# Patient Record
Sex: Female | Born: 1960 | Race: White | Hispanic: No | Marital: Married | Smoking: Never smoker
Health system: Southern US, Community
[De-identification: ages and names within clinical notes are randomized; demographics above are authoritative.]

## PROBLEM LIST (undated history)

## (undated) DIAGNOSIS — C50919 Malignant neoplasm of unspecified site of unspecified female breast: Secondary | ICD-10-CM

## (undated) HISTORY — PX: LAPAROSCOPY: SHX197

## (undated) HISTORY — DX: Malignant neoplasm of unspecified site of unspecified female breast: C50.919

## (undated) HISTORY — PX: HYSTEROTOMY: SHX1776

## (undated) HISTORY — PX: OTHER SURGICAL HISTORY: SHX169

## (undated) HISTORY — PX: PORT WINE STAIN REMOVAL W/ LASER: SHX2245

## (undated) HISTORY — PX: PORTACATH PLACEMENT: SHX2246

---

## 1998-01-02 ENCOUNTER — Inpatient Hospital Stay (HOSPITAL_COMMUNITY): Admission: AD | Admit: 1998-01-02 | Discharge: 1998-01-06 | Payer: Self-pay | Admitting: *Deleted

## 1998-01-06 ENCOUNTER — Encounter (HOSPITAL_COMMUNITY): Admission: RE | Admit: 1998-01-06 | Discharge: 1998-04-06 | Payer: Self-pay | Admitting: *Deleted

## 1998-02-23 ENCOUNTER — Other Ambulatory Visit: Admission: RE | Admit: 1998-02-23 | Discharge: 1998-02-23 | Payer: Self-pay | Admitting: *Deleted

## 1999-05-10 ENCOUNTER — Other Ambulatory Visit: Admission: RE | Admit: 1999-05-10 | Discharge: 1999-05-10 | Payer: Self-pay | Admitting: *Deleted

## 2000-07-01 ENCOUNTER — Other Ambulatory Visit: Admission: RE | Admit: 2000-07-01 | Discharge: 2000-07-01 | Payer: Self-pay | Admitting: *Deleted

## 2001-07-09 ENCOUNTER — Other Ambulatory Visit: Admission: RE | Admit: 2001-07-09 | Discharge: 2001-07-09 | Payer: Self-pay | Admitting: *Deleted

## 2002-10-13 ENCOUNTER — Other Ambulatory Visit: Admission: RE | Admit: 2002-10-13 | Discharge: 2002-10-13 | Payer: Self-pay | Admitting: *Deleted

## 2003-12-28 ENCOUNTER — Other Ambulatory Visit: Admission: RE | Admit: 2003-12-28 | Discharge: 2003-12-28 | Payer: Self-pay | Admitting: *Deleted

## 2005-10-08 DIAGNOSIS — C50919 Malignant neoplasm of unspecified site of unspecified female breast: Secondary | ICD-10-CM

## 2005-10-08 HISTORY — DX: Malignant neoplasm of unspecified site of unspecified female breast: C50.919

## 2006-08-06 ENCOUNTER — Encounter: Admission: RE | Admit: 2006-08-06 | Discharge: 2006-08-06 | Payer: Self-pay | Admitting: Obstetrics and Gynecology

## 2006-08-06 ENCOUNTER — Encounter (INDEPENDENT_AMBULATORY_CARE_PROVIDER_SITE_OTHER): Payer: Self-pay | Admitting: *Deleted

## 2006-08-06 ENCOUNTER — Encounter (INDEPENDENT_AMBULATORY_CARE_PROVIDER_SITE_OTHER): Payer: Self-pay | Admitting: Diagnostic Radiology

## 2006-08-15 ENCOUNTER — Encounter: Admission: RE | Admit: 2006-08-15 | Discharge: 2006-08-15 | Payer: Self-pay

## 2006-08-20 ENCOUNTER — Encounter (INDEPENDENT_AMBULATORY_CARE_PROVIDER_SITE_OTHER): Payer: Self-pay | Admitting: Diagnostic Radiology

## 2006-08-20 ENCOUNTER — Encounter (INDEPENDENT_AMBULATORY_CARE_PROVIDER_SITE_OTHER): Payer: Self-pay | Admitting: Specialist

## 2006-08-20 ENCOUNTER — Encounter: Admission: RE | Admit: 2006-08-20 | Discharge: 2006-08-20 | Payer: Self-pay

## 2006-09-10 ENCOUNTER — Ambulatory Visit (HOSPITAL_BASED_OUTPATIENT_CLINIC_OR_DEPARTMENT_OTHER): Admission: RE | Admit: 2006-09-10 | Discharge: 2006-09-11 | Payer: Self-pay | Admitting: General Surgery

## 2006-09-10 ENCOUNTER — Encounter (INDEPENDENT_AMBULATORY_CARE_PROVIDER_SITE_OTHER): Payer: Self-pay | Admitting: *Deleted

## 2006-10-07 ENCOUNTER — Ambulatory Visit (HOSPITAL_COMMUNITY): Payer: Self-pay | Admitting: Oncology

## 2006-10-07 ENCOUNTER — Encounter (HOSPITAL_COMMUNITY): Admission: RE | Admit: 2006-10-07 | Discharge: 2006-10-07 | Payer: Self-pay | Admitting: Oncology

## 2006-10-10 ENCOUNTER — Ambulatory Visit (HOSPITAL_COMMUNITY): Admission: RE | Admit: 2006-10-10 | Discharge: 2006-10-10 | Payer: Self-pay | Admitting: Oncology

## 2006-10-10 ENCOUNTER — Encounter (HOSPITAL_COMMUNITY): Admission: RE | Admit: 2006-10-10 | Discharge: 2006-11-09 | Payer: Self-pay | Admitting: Oncology

## 2006-10-18 ENCOUNTER — Ambulatory Visit (HOSPITAL_BASED_OUTPATIENT_CLINIC_OR_DEPARTMENT_OTHER): Admission: RE | Admit: 2006-10-18 | Discharge: 2006-10-18 | Payer: Self-pay | Admitting: General Surgery

## 2006-11-11 ENCOUNTER — Encounter (HOSPITAL_COMMUNITY): Admission: RE | Admit: 2006-11-11 | Discharge: 2006-12-11 | Payer: Self-pay | Admitting: Oncology

## 2006-11-22 ENCOUNTER — Ambulatory Visit (HOSPITAL_COMMUNITY): Payer: Self-pay | Admitting: Oncology

## 2006-12-16 ENCOUNTER — Encounter (HOSPITAL_COMMUNITY): Admission: RE | Admit: 2006-12-16 | Discharge: 2007-01-15 | Payer: Self-pay | Admitting: Oncology

## 2006-12-16 ENCOUNTER — Ambulatory Visit: Payer: Self-pay | Admitting: Cardiology

## 2007-01-17 ENCOUNTER — Ambulatory Visit: Admission: RE | Admit: 2007-01-17 | Discharge: 2007-03-17 | Payer: Self-pay | Admitting: Radiation Oncology

## 2007-01-20 ENCOUNTER — Encounter (HOSPITAL_COMMUNITY): Admission: RE | Admit: 2007-01-20 | Discharge: 2007-02-19 | Payer: Self-pay | Admitting: Oncology

## 2007-01-20 ENCOUNTER — Ambulatory Visit (HOSPITAL_COMMUNITY): Payer: Self-pay | Admitting: Oncology

## 2007-01-29 ENCOUNTER — Ambulatory Visit (HOSPITAL_BASED_OUTPATIENT_CLINIC_OR_DEPARTMENT_OTHER): Admission: RE | Admit: 2007-01-29 | Discharge: 2007-01-29 | Payer: Self-pay | Admitting: General Surgery

## 2007-04-07 ENCOUNTER — Encounter (HOSPITAL_COMMUNITY): Admission: RE | Admit: 2007-04-07 | Discharge: 2007-05-07 | Payer: Self-pay | Admitting: Oncology

## 2007-04-07 ENCOUNTER — Ambulatory Visit (HOSPITAL_COMMUNITY): Payer: Self-pay | Admitting: Oncology

## 2007-05-05 ENCOUNTER — Encounter: Admission: RE | Admit: 2007-05-05 | Discharge: 2007-05-05 | Payer: Self-pay | Admitting: Obstetrics and Gynecology

## 2007-06-20 ENCOUNTER — Ambulatory Visit (HOSPITAL_COMMUNITY): Payer: Self-pay | Admitting: Oncology

## 2007-10-17 ENCOUNTER — Ambulatory Visit (HOSPITAL_COMMUNITY): Payer: Self-pay | Admitting: Oncology

## 2007-10-17 ENCOUNTER — Encounter (HOSPITAL_COMMUNITY): Admission: RE | Admit: 2007-10-17 | Discharge: 2007-11-16 | Payer: Self-pay | Admitting: Oncology

## 2008-04-13 ENCOUNTER — Other Ambulatory Visit (HOSPITAL_COMMUNITY): Payer: Self-pay | Admitting: Oncology

## 2008-04-13 ENCOUNTER — Ambulatory Visit (HOSPITAL_COMMUNITY): Payer: Self-pay | Admitting: Oncology

## 2008-04-14 ENCOUNTER — Encounter (HOSPITAL_COMMUNITY): Admission: RE | Admit: 2008-04-14 | Discharge: 2008-05-14 | Payer: Self-pay | Admitting: Oncology

## 2008-05-05 ENCOUNTER — Encounter: Admission: RE | Admit: 2008-05-05 | Discharge: 2008-05-05 | Payer: Self-pay | Admitting: Oncology

## 2008-05-18 ENCOUNTER — Encounter (HOSPITAL_COMMUNITY): Admission: RE | Admit: 2008-05-18 | Discharge: 2008-06-17 | Payer: Self-pay | Admitting: Oncology

## 2008-07-09 ENCOUNTER — Encounter: Admission: RE | Admit: 2008-07-09 | Discharge: 2008-07-09 | Payer: Self-pay | Admitting: Obstetrics and Gynecology

## 2008-10-05 ENCOUNTER — Ambulatory Visit (HOSPITAL_COMMUNITY): Payer: Self-pay | Admitting: Oncology

## 2008-10-05 ENCOUNTER — Encounter (HOSPITAL_COMMUNITY): Admission: RE | Admit: 2008-10-05 | Discharge: 2008-11-04 | Payer: Self-pay | Admitting: Oncology

## 2008-10-07 ENCOUNTER — Ambulatory Visit (HOSPITAL_COMMUNITY): Admission: RE | Admit: 2008-10-07 | Discharge: 2008-10-07 | Payer: Self-pay | Admitting: Oncology

## 2009-04-19 ENCOUNTER — Encounter (HOSPITAL_COMMUNITY): Admission: RE | Admit: 2009-04-19 | Discharge: 2009-05-19 | Payer: Self-pay | Admitting: Oncology

## 2009-04-19 ENCOUNTER — Ambulatory Visit (HOSPITAL_COMMUNITY): Payer: Self-pay | Admitting: Oncology

## 2009-06-28 ENCOUNTER — Encounter: Admission: RE | Admit: 2009-06-28 | Discharge: 2009-06-28 | Payer: Self-pay | Admitting: Obstetrics and Gynecology

## 2009-10-18 ENCOUNTER — Encounter (HOSPITAL_COMMUNITY): Admission: RE | Admit: 2009-10-18 | Discharge: 2009-11-17 | Payer: Self-pay | Admitting: Oncology

## 2009-10-18 ENCOUNTER — Ambulatory Visit (HOSPITAL_COMMUNITY): Payer: Self-pay | Admitting: Oncology

## 2010-04-18 ENCOUNTER — Ambulatory Visit (HOSPITAL_COMMUNITY): Payer: Self-pay | Admitting: Oncology

## 2010-04-18 ENCOUNTER — Encounter (HOSPITAL_COMMUNITY): Admission: RE | Admit: 2010-04-18 | Discharge: 2010-05-18 | Payer: Self-pay | Admitting: Oncology

## 2010-05-01 ENCOUNTER — Ambulatory Visit (HOSPITAL_COMMUNITY): Admission: RE | Admit: 2010-05-01 | Discharge: 2010-05-01 | Payer: Self-pay | Admitting: Oncology

## 2010-06-29 ENCOUNTER — Encounter: Admission: RE | Admit: 2010-06-29 | Discharge: 2010-06-29 | Payer: Self-pay | Admitting: Obstetrics and Gynecology

## 2010-10-17 ENCOUNTER — Encounter (HOSPITAL_COMMUNITY)
Admission: RE | Admit: 2010-10-17 | Discharge: 2010-11-07 | Payer: Self-pay | Source: Home / Self Care | Attending: Oncology | Admitting: Oncology

## 2010-10-20 ENCOUNTER — Ambulatory Visit (HOSPITAL_COMMUNITY)
Admission: RE | Admit: 2010-10-20 | Discharge: 2010-11-07 | Payer: Self-pay | Source: Home / Self Care | Attending: Oncology | Admitting: Oncology

## 2010-12-24 LAB — COMPREHENSIVE METABOLIC PANEL
ALT: 15 U/L (ref 0–35)
AST: 22 U/L (ref 0–37)
AST: 21 U/L (ref 0–37)
Albumin: 4.2 g/dL (ref 3.5–5.2)
Albumin: 4.2 g/dL (ref 3.5–5.2)
BUN: 15 mg/dL (ref 6–23)
CO2: 29 mEq/L (ref 19–32)
Calcium: 9.7 mg/dL (ref 8.4–10.5)
Chloride: 102 mEq/L (ref 96–112)
Chloride: 101 mEq/L (ref 96–112)
Creatinine, Ser: 0.81 mg/dL (ref 0.4–1.2)
GFR calc Af Amer: >60 mL/min (ref >60)
GFR calc Af Amer: >60 mL/min (ref >60)
GFR calc non Af Amer: >60 mL/min (ref >60)
Sodium: 138 mEq/L (ref 135–145)
Total Bilirubin: 0.5 mg/dL (ref 0.3–1.2)
Total Bilirubin: 0.7 mg/dL (ref 0.3–1.2)
Total Protein: 7.6 g/dL (ref 6.0–8.3)

## 2010-12-24 LAB — CBC
HCT: 37.8 % (ref 36.0–46.0)
Hemoglobin: 12.6 g/dL (ref 12.0–15.0)
MCV: 86.9 fL (ref 78.0–100.0)
Platelets: 181 10*3/uL (ref 150–400)
Platelets: 193 10*3/uL (ref 150–400)
RBC: 4.21 MIL/uL (ref 3.87–5.11)
RDW: 12.3 % (ref 11.5–15.5)
WBC: 6.2 10*3/uL (ref 4.0–10.5)
WBC: 9.3 10*3/uL (ref 4.0–10.5)

## 2010-12-24 LAB — DIFFERENTIAL
Basophils Absolute: 0 10*3/uL (ref 0.0–0.1)
Eosinophils Absolute: 0.1 10*3/uL (ref 0.0–0.7)
Eosinophils Relative: 2 % (ref 0–5)
Eosinophils Relative: 4 % (ref 0–5)
Lymphocytes Relative: 23 % (ref 12–46)
Lymphs Abs: 1.6 10*3/uL (ref 0.7–4.0)
Lymphs Abs: 2.1 10*3/uL (ref 0.7–4.0)
Monocytes Absolute: 0.5 10*3/uL (ref 0.1–1.0)
Monocytes Absolute: 0.7 10*3/uL (ref 0.1–1.0)
Monocytes Relative: 8 % (ref 3–12)
Neutro Abs: 6.1 10*3/uL (ref 1.7–7.7)

## 2011-01-14 LAB — COMPREHENSIVE METABOLIC PANEL
ALT: 15 U/L (ref 0–35)
AST: 22 U/L (ref 0–37)
Alkaline Phosphatase: 43 U/L (ref 39–117)
CO2: 30 mEq/L (ref 19–32)
Chloride: 106 mEq/L (ref 96–112)
GFR calc Af Amer: >60 mL/min (ref >60)
GFR calc non Af Amer: >60 mL/min (ref >60)
Potassium: 3.6 mEq/L (ref 3.5–5.1)
Sodium: 141 mEq/L (ref 135–145)
Total Bilirubin: 0.5 mg/dL (ref 0.3–1.2)

## 2011-01-14 LAB — DIFFERENTIAL
Basophils Absolute: 0.1 10*3/uL (ref 0.0–0.1)
Eosinophils Absolute: 0.1 10*3/uL (ref 0.0–0.7)
Eosinophils Relative: 2 % (ref 0–5)

## 2011-01-14 LAB — FOLLICLE STIMULATING HORMONE: FSH: 125.3 m[IU]/mL — ABNORMAL HIGH

## 2011-01-14 LAB — CBC
MCV: 86.6 fL (ref 78.0–100.0)
RBC: 4.2 MIL/uL (ref 3.87–5.11)
WBC: 6.2 10*3/uL (ref 4.0–10.5)

## 2011-01-14 LAB — CANCER ANTIGEN 27.29: CA 27.29: 16 U/mL (ref 0–39)

## 2011-01-22 LAB — COMPREHENSIVE METABOLIC PANEL
ALT: 15 U/L (ref 0–35)
AST: 22 U/L (ref 0–37)
Calcium: 8.9 mg/dL (ref 8.4–10.5)
Creatinine, Ser: 0.87 mg/dL (ref 0.4–1.2)
GFR calc Af Amer: >60 mL/min (ref >60)
Glucose, Bld: 85 mg/dL (ref 70–99)
Sodium: 138 mEq/L (ref 135–145)
Total Protein: 7.2 g/dL (ref 6.0–8.3)

## 2011-01-22 LAB — DIFFERENTIAL
Eosinophils Absolute: 0.1 10*3/uL (ref 0.0–0.7)
Lymphocytes Relative: 19 % (ref 12–46)
Lymphs Abs: 1.4 10*3/uL (ref 0.7–4.0)
Monocytes Relative: 6 % (ref 3–12)
Neutrophils Relative %: 74 % (ref 43–77)

## 2011-01-22 LAB — LUTEINIZING HORMONE: LH: 43.8 m[IU]/mL

## 2011-01-22 LAB — CBC
MCHC: 34.8 g/dL (ref 30.0–36.0)
MCV: 87.1 fL (ref 78.0–100.0)
RDW: 12 % (ref 11.5–15.5)

## 2011-01-22 LAB — FOLLICLE STIMULATING HORMONE: FSH: 94.6 m[IU]/mL

## 2011-02-23 NOTE — Procedures (Signed)
[name], Rachel Harrell                 ACCOUNT NO.:  [id number]   MEDICAL RECORD NO.:  [id number]          PATIENT TYPE:  REC   LOCATION:  RAD                           FACILITY:  [facility]   PHYSICIAN:  Dani Gobble, MD       DATE OF BIRTH:  05/31/1961   DATE OF PROCEDURE:  10/10/2006  DATE OF DISCHARGE:                                ECHOCARDIOGRAM   INDICATION:  A 50 year old female with a past medical history of left-  sided breast cancer status post mastectomy who was referred for  evaluation of LV function prior to chemotherapy initiation.   The technical part of  this study is limited secondary to patient body  habitus and poor acoustic windows.   The aorta measures normally at 2.6 cm.   The left atrium is normal in size and measured at 3.0 cm.  The patient  appeared to be in sinus rhythm during this procedure.   The interventricular septum and posterior wall are at the upper limits  of normal in thickness.   The aortic valve was not well visualized but appeared to be grossly  structurally normal.  No aortic insufficiency is noted.   The aortic valve is within normal limits.   Mitral valve appeared grossly structurally normal.  No mitral valve  prolapse is noted.  No significant mitral regurgitation is noted.  Doppler interrogation of mitral valve is within normal limits   The pulmonic valve was not well visualized.   Tricuspid valve also appeared grossly structurally normal, although not  well visualized..   The left ventricle was normal in size with the LV IDD measured 3.9 cm.  The LV IC measured 2.7 cm.  Overall, the left ventricular systolic  function was normal and no regional wall motion abnormalities were  noted.   The right atrium and right ventricle are normal in size, and the right  ventricular systolic function is normal.   IMPRESSION:  1. Technically difficult study due to patient body habitus, poor      acoustic windows.  2. Normal left ventricular size  and systolic function without regional      wall motion abnormality noted.           ______________________________  Dani Gobble, MD     AB/MEDQ  D:  10/10/2006  T:  10/10/2006  Job:  [id number]   cc:   Ladona Horns. Mariel Sleet, MD  Fax: [phone]   Dani Gobble, MD  Fax: [phone]

## 2011-02-23 NOTE — Op Note (Signed)
Rachel Harrell, Rachel Harrell                 ACCOUNT NO.:  [id number]   MEDICAL RECORD NO.:  [id number]          PATIENT TYPE:  AMB   LOCATION:  DSC                          FACILITY:  [facility]   PHYSICIAN:  Rose Phi. Maple Hudson, M.D.   DATE OF BIRTH:  April 07, 1961   DATE OF PROCEDURE:  10/18/2006  DATE OF DISCHARGE:                               OPERATIVE REPORT   PREOPERATIVE DIAGNOSIS:  Cancer of the left breast.   POSTOPERATIVE DIAGNOSIS:  Cancer of the left breast.   OPERATION:  Insertion of Port-A-Cath under fluoroscopic control.   SURGEON:  Rose Phi. Maple Hudson, M.D.   ANESTHESIA:  MAC.   OPERATIVE PROCEDURE:  The patient was placed on the operating table with  arms down by the side and the face turned to the left.  The right upper  chest and neck were prepped and draped in the usual fashion.  Under  local anesthesia, a right subclavian puncture was carried out without  difficulty and a guidewire inserted and proper positioning of the  guidewire confirmed by fluoroscopy.   Under local anesthesia, I made an incision on the anterior chest wall  and developed a pocket for the implantable port.  We then tunneled  between the pocket and the subclavian puncture site and passed the  catheter through that and connected it to the port which was then placed  in the pocket.  The catheter tip was then trimmed to go to the level of  the fourth interspace at the cavoatrial junction.  The dilator and peel-  away sheath were then passed over the wire and the wire was removed  followed by the dilator and then the catheter was passed through the  peel-away sheath and then it was removed.  Again, fluoroscopy was used  to confirm that there was no kinking or crimping in the system and that  the catheter tip was in the superior vena cava.   We then anchored the port in the pocket with two 2-0 Prolene sutures.  The incision was closed with 3-0 Vicryl and subcuticular 4-0 Monocryl.  Steri-Strips were applied.  I then  accessed the port and fully  heparinized it and then removed the needle.  Dressings were applied and  the patient transferred to the recovery room in satisfactory condition  having tolerated the procedure well.      Rose Phi. Maple Hudson, M.D.  Electronically Signed     PRY/MEDQ  D:  10/18/2006  T:  10/18/2006  Job:  [id number]

## 2011-02-23 NOTE — Procedures (Signed)
NAMEDELONDA, COLEY                 ACCOUNT NO.:  [id number]   MEDICAL RECORD NO.:  [id number]          PATIENT TYPE:  REC   LOCATION:  RAD                           FACILITY:  [facility]   PHYSICIAN:  Gerrit Friends. Dietrich Pates, MD, FACCDATE OF BIRTH:  [birth year]   DATE OF PROCEDURE:  12/16/2006  DATE OF DISCHARGE:                                ECHOCARDIOGRAM   CLINICAL DATA:  A 50 year old woman receiving chemotherapy.   M-MODE:  Aorta 4.0, left atrium 3.9, septum 1.3, posterior wall 1.1, LV  diastole 3.8, LV systole 2.8.   1. Technically adequate echocardiographic study.  2. Normal left atrium, right atrium, and right ventricle.  3. Slight thickening of the aortic and mitral valves; normal tricuspid      valve.  4. Normal internal dimension of the left ventricle; normal regional      and global function; estimated ejection fraction is 0.65.  5. Normal IVC.  6. Physiologic pericardial effusion.  7. Comparison with prior study of October 10, 2006:  No significant      interval change.      Gerrit Friends. Dietrich Pates, MD, [facility]  Electronically Signed     RMR/MEDQ  D:  12/16/2006  T:  12/17/2006  Job:  [id number]

## 2011-02-23 NOTE — Op Note (Signed)
NAMEDARBIE, BIANCARDI                 ACCOUNT NO.:  [id number]   MEDICAL RECORD NO.:  [id number]          PATIENT TYPE:  AMB   LOCATION:  DSC                          FACILITY:  [facility]   PHYSICIAN:  Rose Phi. Maple Hudson, M.D.   DATE OF BIRTH:  Sep 30, 1961   DATE OF PROCEDURE:  09/10/2006  DATE OF DISCHARGE:                               OPERATIVE REPORT   PREOPERATIVE DIAGNOSIS:  Multicentric carcinoma left breast.   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   PROCEDURE:  1. Blue dye injection.  2. Left axillary sentinel lymph node biopsy.  3. Left total mastectomy.  4. Completion left axillary lymph node dissection.   SURGEON:  Rose Phi. Maple Hudson, M.D.   ANESTHESIA:  General.   OPERATIVE PROCEDURE:  Prior to going to the operating room, 1 mCi of  technetium sulfur colloid was injected intradermally and in the  periareolar area of the left breast.   After suitable general anesthesia was induced the patient was placed in  supine position with the arms extended on the arm board.  5 mL of a  mixture of 2 mL of methylene blue and 3 mL of injectable saline was  injected in the subareolar tissue and the breast gently massaged for 3  minutes.  We then prepped and draped the breast in the standard fashion.   Careful scanning the left axilla revealed not particularly hot spots and  a short transverse axillary incision was made with dissection through  the subcutaneous tissue to the clavipectoral fascia.  Deep to the fascia  was a white gritty lymph node which I excised as a sentinel node.  It  had minimal radioactivity and no blue dye.  I could see some blue  lymphatics but they seemed to dead-end without going into lymph nodes  which made me suspicious of multiple node involvement.  Lymph node was  submitted as a sentinel node.   We then did the mastectomy using a transverse elliptical incisions  incorporating the nipple-areolar complex.  We  then made the incisions  and then dissected the flaps going superiorly to near the clavicle and  medially to the medial border of the sternum and inferiorly to the  rectus fascia at the inframammary fold and laterally to latissimus dorsi  muscle.  We then removed the breast by dissecting from medial to lateral  incorporating the pectoralis fascia.  At the lateral margin of the  breast we incised along the pectoralis major muscle retracting it and  then removed the breast.  Shortly after I did that we were informed by  the pathologist that the sentinel node was negative for metastatic  disease.   I then retracted the pectoralis major and incised along the pectoralis  minor, dissected up and exposed the axillary vein.  We then swept all  the tissue out of the axillary vein from behind the pectoralis minor and  inferior to the vein itself.  The long thoracic thoracodorsal nerves  were identified and preserved and the other cutaneous vessels and nerves  were clipped and divided.  I am afraid there was multiple node  involvement.   With good hemostasis we then thoroughly irrigated the whole field with  saline.   Two 19-Hellickson Blake drains were inserted, one into the axilla and the  other over the anterior chest wall and the pectoralis major muscle.   The skin was then stapled and the suction instituted.  Dressings were  then applied.  The patient transferred to the recovery room in  satisfactory condition having tolerated procedure well.      Rose Phi. Maple Hudson, M.D.  Electronically Signed     PRY/MEDQ  D:  09/10/2006  T:  09/10/2006  Job:  [id number]

## 2011-02-23 NOTE — Op Note (Signed)
Rachel Harrell, Rachel Harrell               ACCOUNT NO.:  [id number]   MEDICAL RECORD NO.:  [id number]            PATIENT TYPE:   LOCATION:                                 FACILITY:   PHYSICIAN:  Rose Phi. Young, M.D.        DATE OF BIRTH:   DATE OF PROCEDURE:  01/29/2007  DATE OF DISCHARGE:                               OPERATIVE REPORT   PREOPERATIVE DIAGNOSIS:  History of left breast cancer.   POSTOPERATIVE DIAGNOSIS:  History of left breast cancer.   OPERATION:  Removal of Port-A-Cath.   SURGEON:  Rose Phi. Maple Hudson, M.D.   ANESTHESIA:  Local.   OPERATIVE PROCEDURE:  The patient was placed on the operating table with  arms by the side.  The right upper chest was prepped and draped in the  usual fashion.  We then obtained local anesthesia; and then I made an  incision in the old port site.  The port was exposed; and the catheter  removed.  Traction on the catheter allowed [place] to divide the two sutures  holding it in place.  The port then slipped out.   There was no bleeding; and the incision was closed with a subcuticular  of 4-0 Monocryl and Steri-Strips.  Dermabond was applied.  A light  dressing applied.  The patient allowed to go home.      Rose Phi. Maple Hudson, M.D.  Electronically Signed     PRY/MEDQ  D:  01/29/2007  T:  01/29/2007  Job:  [id number]

## 2011-03-21 ENCOUNTER — Encounter (HOSPITAL_COMMUNITY): Payer: Self-pay | Admitting: *Deleted

## 2011-03-28 ENCOUNTER — Encounter (HOSPITAL_COMMUNITY): Payer: Self-pay | Admitting: Oncology

## 2011-03-28 ENCOUNTER — Other Ambulatory Visit (HOSPITAL_COMMUNITY): Payer: Self-pay | Admitting: Oncology

## 2011-03-28 DIAGNOSIS — C50919 Malignant neoplasm of unspecified site of unspecified female breast: Secondary | ICD-10-CM

## 2011-03-28 HISTORY — DX: Malignant neoplasm of unspecified site of unspecified female breast: C50.919

## 2011-04-16 ENCOUNTER — Other Ambulatory Visit (HOSPITAL_COMMUNITY): Payer: Self-pay | Admitting: Oncology

## 2011-04-19 ENCOUNTER — Encounter (HOSPITAL_COMMUNITY): Payer: BC Managed Care – PPO | Attending: Oncology

## 2011-04-19 ENCOUNTER — Other Ambulatory Visit (HOSPITAL_COMMUNITY): Payer: Self-pay | Admitting: Oncology

## 2011-04-19 ENCOUNTER — Other Ambulatory Visit (HOSPITAL_COMMUNITY): Payer: Self-pay

## 2011-04-19 DIAGNOSIS — C50919 Malignant neoplasm of unspecified site of unspecified female breast: Secondary | ICD-10-CM | POA: Insufficient documentation

## 2011-04-19 DIAGNOSIS — Z79899 Other long term (current) drug therapy: Secondary | ICD-10-CM | POA: Insufficient documentation

## 2011-04-19 LAB — CBC
HCT: 37 % (ref 36.0–46.0)
Hemoglobin: 12.5 g/dL (ref 12.0–15.0)
MCHC: 33.8 g/dL (ref 30.0–36.0)
RBC: 4.3 MIL/uL (ref 3.87–5.11)
WBC: 5.8 10*3/uL (ref 4.0–10.5)

## 2011-04-19 LAB — DIFFERENTIAL
Basophils Relative: 1 % (ref 0–1)
Eosinophils Absolute: 0.1 10*3/uL (ref 0.0–0.7)
Eosinophils Relative: 2 % (ref 0–5)
Lymphs Abs: 1.6 10*3/uL (ref 0.7–4.0)
Neutrophils Relative %: 62 % (ref 43–77)

## 2011-04-19 LAB — COMPREHENSIVE METABOLIC PANEL
ALT: 11 U/L (ref 0–35)
Alkaline Phosphatase: 65 U/L (ref 39–117)
BUN: 16 mg/dL (ref 6–23)
CO2: 29 mEq/L (ref 19–32)
Chloride: 104 mEq/L (ref 96–112)
GFR calc Af Amer: >60 mL/min (ref >60)
GFR calc non Af Amer: >60 mL/min (ref >60)
Glucose, Bld: 96 mg/dL (ref 70–99)
Potassium: 4.3 mEq/L (ref 3.5–5.1)
Sodium: 141 mEq/L (ref 135–145)
Total Bilirubin: 0.4 mg/dL (ref 0.3–1.2)

## 2011-04-20 ENCOUNTER — Encounter (HOSPITAL_COMMUNITY): Payer: Self-pay | Admitting: Oncology

## 2011-04-20 ENCOUNTER — Encounter (HOSPITAL_BASED_OUTPATIENT_CLINIC_OR_DEPARTMENT_OTHER): Payer: BC Managed Care – PPO | Admitting: Oncology

## 2011-04-20 VITALS — BP 125/86 | HR 92 | Temp 98.6°F | Wt 165.8 lb

## 2011-04-20 DIAGNOSIS — C50919 Malignant neoplasm of unspecified site of unspecified female breast: Secondary | ICD-10-CM

## 2011-04-20 MED ORDER — ANASTROZOLE 1 MG PO TABS: 1.0000 mg | ORAL_TABLET | Freq: Every day | ORAL | Status: DC

## 2011-04-20 NOTE — Patient Instructions (Signed)
[facility]  Discharge Instructions  RECOMMENDATIONS MADE BY THE CONSULTANT AND ANY TEST RESULTS WILL BE SENT TO YOUR REFERRING DOCTOR.   EXAM FINDINGS BY MD TODAY AND SIGNS AND SYMPTOMS TO REPORT TO CLINIC OR PRIMARY MD: Doing well . MEDICATIONS PRESCRIBED: Refills for arimidex x12 Follow label directions  INSTRUCTIONS GIVEN AND DISCUSSED:  Report any new lumps, bone pain or shortness of breath  SPECIAL INSTRUCTIONS/FOLLOW-UP: Lab work Needed  In 6 months  and Return to Clinic on :  In 6 months after labs.   I acknowledge that I have been informed and understand all the instructions given to me and received a copy. I do not have any more questions at this time, but understand that I may call the Specialty Clinic at [facility] at [phone] during business hours should I have any further questions or need assistance in obtaining follow-up care.    __________________________________________  _____________  __________ Signature of Patient or Authorized Representative            Date                   Time    __________________________________________ Nurse's Signature

## 2011-04-20 NOTE — Progress Notes (Signed)
This office note has been dictated.

## 2011-04-20 NOTE — Progress Notes (Signed)
CC:   Rachel Harrell, M.D. Adolph Pollack, M.D. Zelphia Cairo, MD Doreen Beam, MD  DIAGNOSES: 1. 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  Her primary was 1.5 cm.  She     took FEC times 6 cycles in a dose dense fashion followed by     radiation therapy, the latter finished as of June 2008.  She was     started on tamoxifen 03/20/2007, switched her to Arimidex after she     was found have a postmenopausal level of FSH and LH confirmed here     also on 04/19/2009.  She tolerates Arimidex actually better than     the tamoxifen which she wanted to try to switch off of.  Her day of     surgery was 09/10/2006. 2. Nummular eczema of the right lower chest. 3. Anxiety. 4. Abnormal liver enzymes in the past which resolved.  Rachel Harrell looks great, feels great, has a negative oncologic review of systems.  No lumps anywhere.  No nausea.  No vomiting.  No headaches. No bowel problems.  No pains that are new or different, etc.  Energy level is at full speed and her performance status is 0.  Vital signs are all stable.  Weight is up perhaps a pound.  Her labs from the other day are also excellent.  She has clear breath sounds bilaterally.  The left chest wall is clear. The right breast is negative for any masses.  She still has fibroglandular changes, especially in the upper outer quadrant.  There are no masses though.  Heart shows no murmur, rub or gallop.  Abdomen: Soft and nontender without organomegaly.  She has normal bowel sounds. No peripheral edema of the arms or legs.  Again no  adenopathy in any location.  She looks great.  Will see her back in 6 months.  It is also of note that her medications include Fosamax, calcium, vitamin D and the Arimidex.  She is taking in a total of 500 mg of calcium and 1500 mg of vitamin D per day.    ______________________________ Ladona Horns. Mariel Sleet, MD ESN/MEDQ  D:  04/20/2011  T:  04/20/2011  Job:  [id number]

## 2011-06-28 LAB — COMPREHENSIVE METABOLIC PANEL
ALT: 18
AST: 23
Albumin: 4.3
CO2: 27
Calcium: 10.1
Chloride: 101
Creatinine, Ser: 0.91
GFR calc Af Amer: >60
Sodium: 140
Total Bilirubin: 0.7

## 2011-06-28 LAB — DIFFERENTIAL
Eosinophils Absolute: 0.1
Eosinophils Relative: 2
Lymphocytes Relative: 24
Lymphs Abs: 1.4
Monocytes Absolute: 0.6

## 2011-06-28 LAB — CBC
MCV: 85.7
Platelets: 208
RBC: 4.43
WBC: 5.7

## 2011-07-05 LAB — COMPREHENSIVE METABOLIC PANEL
Albumin: 4.3
BUN: 14
Calcium: 9.7
Creatinine, Ser: 0.86
GFR calc Af Amer: >60
Total Bilirubin: 0.7
Total Protein: 7.4

## 2011-07-05 LAB — DIFFERENTIAL
Basophils Absolute: 0
Lymphocytes Relative: 21
Monocytes Absolute: 0.6
Monocytes Relative: 8
Neutro Abs: 5

## 2011-07-05 LAB — CBC
HCT: 37.8
MCV: 87.1
Platelets: 189
RDW: 12.5

## 2011-07-06 LAB — HEPATIC FUNCTION PANEL
ALT: 13
AST: 19
Albumin: 4.4
Indirect Bilirubin: 0.7
Total Bilirubin: 0.8
Total Protein: 7.6

## 2011-07-23 LAB — COMPREHENSIVE METABOLIC PANEL
BUN: 12
CO2: 28
Calcium: 9.1
Creatinine, Ser: 0.73
GFR calc Af Amer: >60
GFR calc non Af Amer: >60
Glucose, Bld: 101 — ABNORMAL HIGH
Total Bilirubin: 0.7

## 2011-07-23 LAB — DIFFERENTIAL
Basophils Absolute: 0
Lymphocytes Relative: 21
Lymphs Abs: 1.2
Neutro Abs: 4
Neutrophils Relative %: 69

## 2011-07-23 LAB — CBC
HCT: 33.5 — ABNORMAL LOW
Hemoglobin: 11.7 — ABNORMAL LOW
MCHC: 35
MCV: 85.8
RBC: 3.91

## 2011-07-31 ENCOUNTER — Other Ambulatory Visit: Payer: Self-pay | Admitting: Obstetrics and Gynecology

## 2011-10-26 ENCOUNTER — Encounter (HOSPITAL_COMMUNITY): Payer: BC Managed Care – PPO | Attending: Oncology

## 2011-10-26 ENCOUNTER — Other Ambulatory Visit (HOSPITAL_COMMUNITY): Payer: BC Managed Care – PPO

## 2011-10-26 DIAGNOSIS — R799 Abnormal finding of blood chemistry, unspecified: Secondary | ICD-10-CM | POA: Insufficient documentation

## 2011-10-26 DIAGNOSIS — C50919 Malignant neoplasm of unspecified site of unspecified female breast: Secondary | ICD-10-CM

## 2011-10-26 LAB — COMPREHENSIVE METABOLIC PANEL
BUN: 19 mg/dL (ref 6–23)
CO2: 29 mEq/L (ref 19–32)
Calcium: 9.6 mg/dL (ref 8.4–10.5)
Creatinine, Ser: 1.21 mg/dL — ABNORMAL HIGH (ref 0.50–1.10)
GFR calc Af Amer: 59 mL/min — ABNORMAL LOW (ref >90)
GFR calc non Af Amer: 51 mL/min — ABNORMAL LOW (ref >90)
Glucose, Bld: 121 mg/dL — ABNORMAL HIGH (ref 70–99)
Total Protein: 7.6 g/dL (ref 6.0–8.3)

## 2011-10-26 LAB — CBC
HCT: 39 % (ref 36.0–46.0)
Hemoglobin: 13.1 g/dL (ref 12.0–15.0)
MCH: 28.7 pg (ref 26.0–34.0)
MCV: 85.3 fL (ref 78.0–100.0)
Platelets: 230 10*3/uL (ref 150–400)
RBC: 4.57 MIL/uL (ref 3.87–5.11)

## 2011-10-26 LAB — DIFFERENTIAL
Eosinophils Absolute: 0.3 10*3/uL (ref 0.0–0.7)
Eosinophils Relative: 4 % (ref 0–5)
Lymphs Abs: 1.7 10*3/uL (ref 0.7–4.0)
Monocytes Absolute: 0.4 10*3/uL (ref 0.1–1.0)
Monocytes Relative: 7 % (ref 3–12)

## 2011-10-26 NOTE — Progress Notes (Signed)
Rachel Harrell presented for labwork. Labs per MD order drawn via Peripheral Line 23 gauge needle inserted in right AC  Good blood return present. Procedure without incident.  Needle removed intact. Patient tolerated procedure well.   

## 2011-10-29 ENCOUNTER — Encounter (HOSPITAL_BASED_OUTPATIENT_CLINIC_OR_DEPARTMENT_OTHER): Payer: BC Managed Care – PPO | Admitting: Oncology

## 2011-10-29 VITALS — BP 132/82 | HR 101 | Temp 98.4°F | Ht 68.0 in | Wt 164.8 lb

## 2011-10-29 DIAGNOSIS — R7989 Other specified abnormal findings of blood chemistry: Secondary | ICD-10-CM

## 2011-10-29 DIAGNOSIS — R799 Abnormal finding of blood chemistry, unspecified: Secondary | ICD-10-CM

## 2011-10-29 DIAGNOSIS — C50919 Malignant neoplasm of unspecified site of unspecified female breast: Secondary | ICD-10-CM

## 2011-10-29 LAB — BASIC METABOLIC PANEL
CO2: 28 mEq/L (ref 19–32)
Calcium: 9.4 mg/dL (ref 8.4–10.5)
Chloride: 103 mEq/L (ref 96–112)
GFR calc Af Amer: >90 mL/min (ref >90)
Sodium: 138 mEq/L (ref 135–145)

## 2011-10-29 NOTE — Progress Notes (Signed)
CC:   Maryln Gottron, M.D. Adolph Pollack, M.D. Zelphia Cairo, MD Doreen Beam, MD  DIAGNOSES: 1. Stage IIIA versus a possible stage IIIB (T4b N2a M0) estrogen     receptor positive 100%, progesterone receptor positive 100%, HER-     2/neu negative breast cancer that was grade 1 invasive ductal of     the left breast with multifocal disease 5/15 positive nodes with     lymphovascular invasion, extracapsular lymph node extension of 1     lymph node at least, and a subdermal metastasis based on     radiological exam and biopsy of this nodule, which is why I was     concerned she had stage IIIB disease.  Her primary was 1.5 cm.  She     was treated with FEC x6 cycles in a dose-dense fashion followed by     radiation therapy.  The latter finishing as of June 2008.  I start     her on tamoxifen on 03/20/2007, switched her to Arimidex after she     was found to have post menopausal levels of FSH and LH, confirmed     here also on 04/19/2009, and she is tolerating that quite well,     better than the tamoxifen.  Her date of surgery was 09/10/2006. 2. Elevation in her creatinine to 1.21 which is out of context for     her.  We will repeat it today to see where we stand. 3. Nummular eczema of the right lower chest. 4. Anxiety over this diagnosed breast cancer. 5. Abnormal liver enzymes in the past which have resolved.  HISTORY:  Rachel Harrell had a viral-like illness sometime around Christmas.  She had 1 episode of nausea, no diarrhea, was able to drink fluids after few hours, kept hydrated she states, is not on any new medication, and has not lost weight, etc. She is feeling good.  REVIEW OF SYSTEMS:  Oncologic is negative.  She is accompanied by her husband who also confirms the above, but her laboratory values which were done on the 18th of January showed a creatinine of 1.21 which is up from 0.78 in July.  Her sugar was 121, but she had eaten, and this was definitely a postprandial sugar.  CBC, differential, and other labs were quite good including electrolytes. So will see what a BMET shows today her.  PHYSICAL EXAMINATION:  Unremarkable.  Vital signs: Weight is 164 pounds, BMI 25.1.  She is 5 feet 8 inches tall, and this is her usual weight. She has a blood pressure 132/82, pulse right around 100 and regular, temperature is normal, and respirations 16-18 and unlabored.  She is in no pain. Left chest wall shows minimal radiation therapy changes only. No nodularity. Lymph: She has no adenopathy in the cervical, supraclavicular, infraclavicular, axillary, or inguinal areas.  Abdomen: She has no hepatosplenomegaly.  Bowel sounds are normal.  Breasts: Right breast is negative for any masses, though remains lumpy bumpy.  Her lungs are clear.  Extremities: Again, no arm edema.  No leg edema.  ASSESSMENT AND PLAN:  So she looks great, but I think we need to check the creatinine and if it is still high reevaluate her with other tests including an ultrasound of her kidneys to make sure she is not obstructed, and order a 24-hour urine for creatinine clearance, total urinary protein, and possibly a Nephrology consultation.    ______________________________ Ladona Horns. Mariel Sleet, MD ESN/MEDQ  D:  10/29/2011  T:  10/29/2011  Job:  [id number]

## 2011-10-29 NOTE — Progress Notes (Signed)
This office note has been dictated.

## 2011-10-29 NOTE — Progress Notes (Signed)
Rachel Harrell presented for [employer]. Labs per MD order drawn via Peripheral Line 25 gauge needle inserted in rt   Procedure without incident.  Needle removed intact. Patient tolerated procedure well.

## 2011-10-29 NOTE — Patient Instructions (Signed)
[facility]  Discharge Instructions  RECOMMENDATIONS MADE BY THE CONSULTANT AND ANY TEST RESULTS WILL BE SENT TO YOUR REFERRING DOCTOR.   EXAM FINDINGS BY MD TODAY AND SIGNS AND SYMPTOMS TO REPORT TO CLINIC OR PRIMARY MD:  Exam good.  INSTRUCTIONS GIVEN AND DISCUSSED: We will recheck your creatine today because it was a little high last week.  SPECIAL INSTRUCTIONS/FOLLOW-UP: Lab work Needed in 6 months and Return to see Neijstrom after labs.  I acknowledge that I have been informed and understand all the instructions given to me and received a copy. I do not have any more questions at this time, but understand that I may call the Specialty Clinic at [facility] at [phone] during business hours should I have any further questions or need assistance in obtaining follow-up care.    __________________________________________  _____________  __________ Signature of Patient or Authorized Representative            Date                   Time    __________________________________________ Nurse's Signature

## 2011-10-30 ENCOUNTER — Telehealth (HOSPITAL_COMMUNITY): Payer: Self-pay

## 2011-10-30 NOTE — Telephone Encounter (Signed)
Message left for patient regarding creatinine level back at normal.

## 2012-01-02 ENCOUNTER — Other Ambulatory Visit: Payer: Self-pay | Admitting: Internal Medicine

## 2012-01-02 DIAGNOSIS — Z1231 Encounter for screening mammogram for malignant neoplasm of breast: Secondary | ICD-10-CM

## 2012-01-02 DIAGNOSIS — Z9012 Acquired absence of left breast and nipple: Secondary | ICD-10-CM

## 2012-01-10 ENCOUNTER — Ambulatory Visit
Admission: RE | Admit: 2012-01-10 | Discharge: 2012-01-10 | Disposition: A | Discharge status: Home / Self Care | Payer: BC Managed Care – PPO | Source: Ambulatory Visit | Attending: Internal Medicine | Admitting: Internal Medicine

## 2012-01-10 DIAGNOSIS — Z9012 Acquired absence of left breast and nipple: Secondary | ICD-10-CM

## 2012-01-10 DIAGNOSIS — Z1231 Encounter for screening mammogram for malignant neoplasm of breast: Secondary | ICD-10-CM

## 2012-04-28 ENCOUNTER — Other Ambulatory Visit (HOSPITAL_COMMUNITY): Payer: BC Managed Care – PPO

## 2012-04-30 ENCOUNTER — Other Ambulatory Visit (HOSPITAL_COMMUNITY): Payer: Self-pay | Admitting: Oncology

## 2012-04-30 ENCOUNTER — Encounter (HOSPITAL_COMMUNITY): Payer: Self-pay | Admitting: Oncology

## 2012-04-30 ENCOUNTER — Ambulatory Visit (HOSPITAL_COMMUNITY): Payer: BC Managed Care – PPO | Admitting: Oncology

## 2012-04-30 ENCOUNTER — Encounter (HOSPITAL_COMMUNITY): Payer: BC Managed Care – PPO | Attending: Oncology | Admitting: Oncology

## 2012-04-30 VITALS — BP 122/81 | HR 92 | Temp 98.6°F | Wt 167.2 lb

## 2012-04-30 DIAGNOSIS — C50919 Malignant neoplasm of unspecified site of unspecified female breast: Secondary | ICD-10-CM

## 2012-04-30 DIAGNOSIS — Z139 Encounter for screening, unspecified: Secondary | ICD-10-CM

## 2012-04-30 DIAGNOSIS — Z170 Estrogen receptor positive status [ER+]: Secondary | ICD-10-CM

## 2012-04-30 MED ORDER — TAMOXIFEN CITRATE 20 MG PO TABS: 20.0000 mg | ORAL_TABLET | Freq: Every day | ORAL | Status: AC

## 2012-04-30 NOTE — Patient Instructions (Addendum)
Rachel Harrell  [id number] 31-Oct-1960 Dr. Glenford Peers   [facility]  Discharge Instructions  RECOMMENDATIONS MADE BY THE CONSULTANT AND ANY TEST RESULTS WILL BE SENT TO YOUR REFERRING DOCTOR.   EXAM FINDINGS BY MD TODAY AND SIGNS AND SYMPTOMS TO REPORT TO CLINIC OR PRIMARY MD: you are doing well.  No evidence of recurrence by exam.  Check with Dr. Karilyn Cota about when you need to get your next colonoscopy.  We will switch you to Tamoxifen when you finish your current supply of Arimidex.  Report any problems with the tamoxifen.  MEDICATIONS PRESCRIBED: Tamoxifen  20 mg daily Follow label directions  INSTRUCTIONS GIVEN AND DISCUSSED: Other :  Report any new lumps, bone pain or shortness of breath.  SPECIAL INSTRUCTIONS/FOLLOW-UP: Lab work Needed in April 2014, Xray Studies Needed :  MRI of breast with your mammogram in April and Return to Clinic in April after Mammogram and MRI to see MD.   I acknowledge that I have been informed and understand all the instructions given to me and received a copy. I do not have any more questions at this time, but understand that I may call the Specialty Clinic at [facility] at [phone] during business hours should I have any further questions or need assistance in obtaining follow-up care.    __________________________________________  _____________  __________ Signature of Patient or Authorized Representative            Date                   Time    __________________________________________ Nurse's Signature

## 2012-04-30 NOTE — Progress Notes (Signed)
Problem #1 stage IIIa versus possible stage IIIB (T4 BN2A N0) ER +100% PR positive on her percent HER-2/neu negative grade 1 breast cancer invasive ductal type left breast with multifocal disease in 5 of 15 positive nodes with LV I extracapsular extension and one lymph node at least and a subdermal metastasis based upon radiological exam and biopsy of this nodule which is why I considered her stage IIIB disease with a prior it was 1.5 cm status post FEC x6 cycles in a dose dense fashion followed by radiation therapy the latter finishing in June of 2008 and she started on tamoxifen on 03/20/2007 but was switched to Arimidex after she was having some tolerance issues and was found be postmenopausal with menopausal levels of FSH and LH. She had are he had a hysterectomy and only part of one ovary was left. Her date of surgery was 09/10/2006. She has essentially minimal other issues such as eczema of the right lower chest and at one time she had some abnormal liver enzymes which resolved on their own. Her review of systems from an oncologic standpoint remains negative.  She does have dense breast tissue the remaining right breast and we will consider an MRI of her right breast every other year going forward. We will set that up for next April along with a mammogram.  Vital signs are stable lymph nodes remain negative throughout. The left chest wall is somewhat pigmented but without masses. The right breast is lumpy bumpy but without masses. Heart shows a regular rhythm and rate without murmur rub or gallop. Lungs are clear to auscultation and percussion. There is no thyromegaly. Abdomen shows normal bowel sounds no hepatosplenomegaly no distention no ascites. She has no arm or leg edema.  She is doing extremely well. She has finished essentially 5 full years now of hormonal therapy but with the new data out on ER positive breast cancers in young women she is a good candidate for 10 years of therapy the last 5  years will be with tamoxifen. I think she is an excellent candidate for this. She is willing to participate and we went over the side effects once again. We will see her sooner than April if need be.

## 2012-05-02 ENCOUNTER — Other Ambulatory Visit (HOSPITAL_COMMUNITY): Payer: BC Managed Care – PPO

## 2012-05-06 ENCOUNTER — Ambulatory Visit (HOSPITAL_COMMUNITY): Payer: BC Managed Care – PPO | Admitting: Oncology

## 2013-01-19 ENCOUNTER — Ambulatory Visit (HOSPITAL_COMMUNITY): Admission: RE | Admit: 2013-01-19 | Payer: BC Managed Care – PPO | Source: Ambulatory Visit

## 2013-01-19 ENCOUNTER — Ambulatory Visit (HOSPITAL_COMMUNITY): Payer: BC Managed Care – PPO

## 2013-01-28 ENCOUNTER — Ambulatory Visit (HOSPITAL_COMMUNITY): Payer: BC Managed Care – PPO

## 2013-01-29 ENCOUNTER — Ambulatory Visit (HOSPITAL_COMMUNITY)
Admission: RE | Admit: 2013-01-29 | Discharge: 2013-01-29 | Disposition: A | Discharge status: Home / Self Care | Payer: BC Managed Care – PPO | Source: Ambulatory Visit | Attending: Oncology | Admitting: Oncology

## 2013-01-29 DIAGNOSIS — Z923 Personal history of irradiation: Secondary | ICD-10-CM | POA: Insufficient documentation

## 2013-01-29 DIAGNOSIS — C50919 Malignant neoplasm of unspecified site of unspecified female breast: Secondary | ICD-10-CM | POA: Insufficient documentation

## 2013-01-29 DIAGNOSIS — Z139 Encounter for screening, unspecified: Secondary | ICD-10-CM

## 2013-01-29 DIAGNOSIS — Z09 Encounter for follow-up examination after completed treatment for conditions other than malignant neoplasm: Secondary | ICD-10-CM | POA: Insufficient documentation

## 2013-01-29 DIAGNOSIS — Z9221 Personal history of antineoplastic chemotherapy: Secondary | ICD-10-CM | POA: Insufficient documentation

## 2013-01-29 MED ORDER — GADOBENATE DIMEGLUMINE 529 MG/ML IV SOLN
15.0000 mL | Freq: Once | INTRAVENOUS | Status: AC | PRN
  Administered 2013-01-29: 15 mL via INTRAVENOUS

## 2013-01-30 ENCOUNTER — Ambulatory Visit (HOSPITAL_COMMUNITY): Payer: BC Managed Care – PPO | Admitting: Oncology

## 2013-02-02 ENCOUNTER — Ambulatory Visit (HOSPITAL_COMMUNITY): Payer: BC Managed Care – PPO | Admitting: Oncology

## 2013-02-02 ENCOUNTER — Telehealth (HOSPITAL_COMMUNITY): Payer: Self-pay

## 2013-02-02 NOTE — Telephone Encounter (Signed)
Message copied by Evelena Leyden on Mon Feb 02, 2013  3:07 PM ------      Message from: Mariel Sleet, ERIC S      Created: Mon Feb 02, 2013  2:11 PM       Negative and they don't recommend another one, just mammogram next year. ------

## 2013-02-02 NOTE — Telephone Encounter (Signed)
Notified that scan was negative and that will only need mammogram next year.

## 2013-02-02 NOTE — Telephone Encounter (Signed)
Patient requesting results of MRI of breast done on 01/29/13.

## 2013-02-02 NOTE — Telephone Encounter (Signed)
Message left and requested that patient call the office.

## 2013-03-10 ENCOUNTER — Encounter (HOSPITAL_COMMUNITY): Payer: BC Managed Care – PPO | Attending: Oncology | Admitting: Oncology

## 2013-03-10 ENCOUNTER — Encounter (HOSPITAL_COMMUNITY): Payer: Self-pay | Admitting: Oncology

## 2013-03-10 VITALS — BP 123/81 | HR 90 | Temp 98.6°F | Resp 16 | Wt 168.4 lb

## 2013-03-10 DIAGNOSIS — C50912 Malignant neoplasm of unspecified site of left female breast: Secondary | ICD-10-CM

## 2013-03-10 DIAGNOSIS — C50919 Malignant neoplasm of unspecified site of unspecified female breast: Secondary | ICD-10-CM

## 2013-03-10 DIAGNOSIS — Z170 Estrogen receptor positive status [ER+]: Secondary | ICD-10-CM

## 2013-03-10 MED ORDER — VENLAFAXINE HCL 37.5 MG PO TABS: 37.5000 mg | ORAL_TABLET | Freq: Two times a day (BID) | ORAL | Status: DC

## 2013-03-10 NOTE — Patient Instructions (Addendum)
[facility] Discharge Instructions  RECOMMENDATIONS MADE BY THE CONSULTANT AND ANY TEST RESULTS WILL BE SENT TO YOUR REFERRING PHYSICIAN.  EXAM FINDINGS BY THE PHYSICIAN TODAY AND SIGNS OR SYMPTOMS TO REPORT TO CLINIC OR PRIMARY PHYSICIAN: Exam and discussion by MD.  No evidence of recurrence by exam.  MEDICATIONS PRESCRIBED:  Effexor 37.5mg  daily for a couple of weeks then increase to twice daily if needed.  INSTRUCTIONS GIVEN AND DISCUSSED: Report any new lumps, bone pain, shortness of breath or other symptoms.  SPECIAL INSTRUCTIONS/FOLLOW-UP: Follow-up in 1 year.  Thank you for choosing [facility] to provide your oncology and hematology care.  To afford each patient quality time with our providers, please arrive at least 15 minutes before your scheduled appointment time.  With your help, our goal is to use those 15 minutes to complete the necessary work-up to ensure our physicians have the information they need to help with your evaluation and healthcare recommendations.    Effective January 1st, 2014, we ask that you re-schedule your appointment with our physicians should you arrive 10 or more minutes late for your appointment.  We strive to give you quality time with our providers, and arriving late affects you and other patients whose appointments are after yours.    Again, thank you for choosing [facility].  Our hope is that these requests will decrease the amount of time that you wait before being seen by our physicians.       _____________________________________________________________  Should you have questions after your visit to [facility], please contact our office at [phone] between the hours of 8:30 a.m. and 5:00 p.m.  Voicemails left after 4:30 p.m. will not be returned until the following business day.  For prescription refill requests, have your pharmacy contact our office with your prescription refill  request.

## 2013-03-10 NOTE — Progress Notes (Signed)
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 She had a biopsy proven subdermal metastasis picked up by radiology with biopsy. That is why I felt she had stage IIIB disease. Her largest tumor nodules 1.5 cm and she took FEC x6 cycles in a dose dense fashion followed by radiation therapy. That was completed in June of 2008. We started her on tamoxifen on 03/20/2007 but switched her to Arimidex after she was having some intolerance issues. She was however found to have postmenopausal levels of FSH and LH.  #2 elevation in her creatinine in the past back to normal. #3 nummular eczema of the right lower chest and what appears to be a heat rash versus latex sensitivity over the sternum secondary to her prosthesis #4 abnormal liver enzymes in the past which resolved spontaneously   She is doing very very well and the radiologist did not feel she needs anything more than screening mammography yearly going forward. Therefore we will not schedule her for any more MRIs unless things change. She is still working full-time. She has a negative oncology review of systems. What does bother her R. the hot flashes and some inability to sleep. We did discuss trying Effexor which she wants to consider.  Vital signs are very stable. She is in no acute distress. She has a negative left chest wall except for surgical and radiation therapy changes. There is no nodularity. There is a reddish slightly. At rash over the sternal area that extends for about 4-5 inches consistent with a heat rash in my opinion she has something similar over the medial right breast of this could be contact dermatitis as well. It does not itch that much. She has no lymphadenopathy in any location. Lungs are clear to auscultation and  percussion. Skin exam is negative. She has no thyromegaly. The right breast is negative for masses. She does have what appears to be a tick bite to the right lateral portion of the breast which is healing. It does not look infected. Abdomen remains soft and nontender without hepatosplenomegaly. Bowel sounds are normal. Heart reveals no murmur rub or gallop. His a regular rhythm and rate. She has no arm edema or leg edema.  She looks great we'll see her back here in a year. I do not see any need for blood work at this time.

## 2013-07-20 ENCOUNTER — Other Ambulatory Visit (HOSPITAL_COMMUNITY): Payer: Self-pay | Admitting: Oncology

## 2013-07-20 DIAGNOSIS — C50912 Malignant neoplasm of unspecified site of left female breast: Secondary | ICD-10-CM

## 2013-07-20 MED ORDER — TAMOXIFEN CITRATE 20 MG PO TABS: 20.0000 mg | ORAL_TABLET | Freq: Every day | ORAL | Status: DC

## 2014-03-10 ENCOUNTER — Ambulatory Visit (HOSPITAL_COMMUNITY): Payer: BC Managed Care – PPO

## 2014-03-11 NOTE — Progress Notes (Signed)
This encounter was created in error - please disregard.

## 2014-03-26 ENCOUNTER — Encounter (HOSPITAL_COMMUNITY): Payer: BC Managed Care – PPO | Attending: Hematology and Oncology

## 2014-03-26 ENCOUNTER — Encounter (HOSPITAL_COMMUNITY): Payer: Self-pay

## 2014-03-26 VITALS — BP 120/80 | HR 102 | Temp 98.5°F | Resp 16 | Wt 165.8 lb

## 2014-03-26 DIAGNOSIS — Z79899 Other long term (current) drug therapy: Secondary | ICD-10-CM | POA: Insufficient documentation

## 2014-03-26 DIAGNOSIS — Z923 Personal history of irradiation: Secondary | ICD-10-CM | POA: Insufficient documentation

## 2014-03-26 DIAGNOSIS — Z170 Estrogen receptor positive status [ER+]: Secondary | ICD-10-CM | POA: Insufficient documentation

## 2014-03-26 DIAGNOSIS — Z9221 Personal history of antineoplastic chemotherapy: Secondary | ICD-10-CM | POA: Insufficient documentation

## 2014-03-26 DIAGNOSIS — C50919 Malignant neoplasm of unspecified site of unspecified female breast: Secondary | ICD-10-CM

## 2014-03-26 DIAGNOSIS — C779 Secondary and unspecified malignant neoplasm of lymph node, unspecified: Secondary | ICD-10-CM | POA: Insufficient documentation

## 2014-03-26 LAB — CBC WITH DIFFERENTIAL/PLATELET
BASOS PCT: 1 % (ref 0–1)
Basophils Absolute: 0.1 10*3/uL (ref 0.0–0.1)
EOS ABS: 0.2 10*3/uL (ref 0.0–0.7)
Eosinophils Relative: 3 % (ref 0–5)
HCT: 37.1 % (ref 36.0–46.0)
HEMOGLOBIN: 12.7 g/dL (ref 12.0–15.0)
Lymphocytes Relative: 20 % (ref 12–46)
Lymphs Abs: 1.4 10*3/uL (ref 0.7–4.0)
MCH: 29.7 pg (ref 26.0–34.0)
MCHC: 34.2 g/dL (ref 30.0–36.0)
MCV: 86.9 fL (ref 78.0–100.0)
MONO ABS: 0.6 10*3/uL (ref 0.1–1.0)
MONOS PCT: 9 % (ref 3–12)
NEUTROS PCT: 67 % (ref 43–77)
Neutro Abs: 4.6 10*3/uL (ref 1.7–7.7)
Platelets: 222 10*3/uL (ref 150–400)
RBC: 4.27 MIL/uL (ref 3.87–5.11)
RDW: 12.3 % (ref 11.5–15.5)
WBC: 6.8 10*3/uL (ref 4.0–10.5)

## 2014-03-26 LAB — COMPREHENSIVE METABOLIC PANEL
ALBUMIN: 3.8 g/dL (ref 3.5–5.2)
ALT: 14 U/L (ref 0–35)
AST: 18 U/L (ref 0–37)
Alkaline Phosphatase: 56 U/L (ref 39–117)
BUN: 15 mg/dL (ref 6–23)
CO2: 28 mEq/L (ref 19–32)
CREATININE: 0.83 mg/dL (ref 0.50–1.10)
Calcium: 9.1 mg/dL (ref 8.4–10.5)
Chloride: 104 mEq/L (ref 96–112)
GFR calc Af Amer: >90 mL/min (ref >90)
GFR calc non Af Amer: 79 mL/min — ABNORMAL LOW (ref >90)
Glucose, Bld: 114 mg/dL — ABNORMAL HIGH (ref 70–99)
POTASSIUM: 3.8 meq/L (ref 3.7–5.3)
Sodium: 144 mEq/L (ref 137–147)
TOTAL PROTEIN: 7.3 g/dL (ref 6.0–8.3)
Total Bilirubin: 0.2 mg/dL — ABNORMAL LOW (ref 0.3–1.2)

## 2014-03-26 NOTE — Progress Notes (Signed)
Rachel Harrell presented for labwork. Labs per MD order drawn via Peripheral Line 23 gauge needle inserted in right AC  Good blood return present. Procedure without incident.  Needle removed intact. Patient tolerated procedure well.   

## 2014-03-26 NOTE — Patient Instructions (Signed)
[place] Discharge Instructions  RECOMMENDATIONS MADE BY THE CONSULTANT AND ANY TEST RESULTS WILL BE SENT TO YOUR REFERRING PHYSICIAN.  EXAM FINDINGS BY THE PHYSICIAN TODAY AND SIGNS OR SYMPTOMS TO REPORT TO CLINIC OR PRIMARY PHYSICIAN: Exam and findings as discussed by Dr. Barnet Glasgow.  You are doing well.  Will check your vitamin D level along with your other labs today.  Mammogram due. Report any new lumps, bone pain, shortness of breath or other symptoms.  MEDICATIONS PRESCRIBED:  Continue tamoxifen  INSTRUCTIONS/FOLLOW-UP: Follow-up in 1 year with labs and office visit.  Thank you for choosing [place] to provide your oncology and hematology care.  To afford each patient quality time with our providers, please arrive at least 15 minutes before your scheduled appointment time.  With your help, our goal is to use those 15 minutes to complete the necessary work-up to ensure our physicians have the information they need to help with your evaluation and healthcare recommendations.    Effective January 1st, 2014, we ask that you re-schedule your appointment with our physicians should you arrive 10 or more minutes late for your appointment.  We strive to give you quality time with our providers, and arriving late affects you and other patients whose appointments are after yours.    Again, thank you for choosing [facility].  Our hope is that these requests will decrease the amount of time that you wait before being seen by our physicians.       _____________________________________________________________  Should you have questions after your visit to [facility], please contact our office at [phone] between the hours of 8:30 a.m. and 5:00 p.m.  Voicemails left after 4:30 p.m. will not be returned until the following business day.  For prescription refill requests, have your pharmacy contact our office with your prescription  refill request.

## 2014-03-26 NOTE — Progress Notes (Signed)
Rachel Harrell  OFFICE PROGRESS NOTE  VYAS,DHRUV B., MD [address] [place] [address]  DIAGNOSIS: Invasive ductal carcinoma of breast - Plan: Calcium Carbonate (CALTRATE 600 PO), Cholecalciferol (VITAMIN D PO), CBC with Differential, Comprehensive metabolic panel, CEA, Cancer antigen 27.29, MM Digital Diagnostic Unilat R, Consult to genetics, Vitamin D 25 hydroxy, MM Digital Diagnostic Unilat R, Vitamin D 25 hydroxy, CBC with Differential, Comprehensive metabolic panel, CEA, Cancer antigen 27.29  No chief complaint on file.   CURRENT THERAPY: Tamoxifen 20 mg daily along with the Effexor.  INTERVAL HISTORY: Rachel Harrell 53 y.o. female returns for followup of stage IIIA (cancer, multifocal disease with 5 of 15 lymph nodes positive along with extracapsular extension at least one lymph node with largest nodule 1.5 cm, status post 6 cycles of FEC in a dose dense fashion followed by radiotherapy which was completed in June of 2008. She was on tamoxifen on 03/20/2007 but we switch to anastrozole after she was having intolerance issues. This was despite biochemical hormonal verification of postmenopausal state. Tumor was ER/PR positive, HER-2/neu not over amplified. Last year she was tried on Effexor to help with hot flashes without much success. She continues on tamoxifen 20 mg daily. Appetite is good with no sore throat, headache, but with nasal symptoms are well controlled. She denies any cough, wheezing, abdominal pain, nausea, vomiting, diarrhea, constipation, melena, hematochezia, hematuria, vaginal bleeding or discharge, dysuria, incontinence, lower extremity swelling or redness, joint pain, skin rash, headache, or seizures.  MEDICAL HISTORY: Past Medical History  Diagnosis Date  . Breast cancer   . Invasive ductal carcinoma of left breast 03/28/2011    INTERIM HISTORY: has Invasive ductal carcinoma of left breast on her problem list.   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 She had a biopsy proven subdermal metastasis picked up by radiology with biopsy. That is why I felt she had stage IIIB disease. Her largest tumor nodules 1.5 cm and she took FEC x6 cycles in a dose dense fashion followed by radiation therapy. That was completed in June of 2008. We started her on tamoxifen on 03/20/2007 but switched her to Arimidex after she was having some intolerance issues. She was however found to have postmenopausal levels of FSH and LH.  ALLERGIES:  is allergic to codeine and morphine and related.  MEDICATIONS: has a current medication list which includes the following prescription(s): calcium carbonate, cholecalciferol, diphenhydramine-acetaminophen, multivitamin, tamoxifen, and venlafaxine.  SURGICAL HISTORY:  Past Surgical History  Procedure Laterality Date  . Laparoscopy      endometrosis  . Lt. mastectomy    . Hysterotomy      1 ovary remains  . Portacath placement    . Port wine stain removal w/ laser      FAMILY HISTORY: family history is negative for Anesthesia problems.  SOCIAL HISTORY:  reports that she has never smoked. She has never used smokeless tobacco. She reports that she does not drink alcohol or use illicit drugs.  REVIEW OF SYSTEMS:  Other than that discussed above is noncontributory.  PHYSICAL EXAMINATION: ECOG PERFORMANCE STATUS: 0 - Asymptomatic  Blood pressure 120/80, pulse 102, temperature 98.5 F (36.9 C), temperature source Oral, resp. rate 16, weight 165 lb 12.8  oz (75.206 kg).  GENERAL:alert, no distress and comfortable SKIN: skin color, texture, turgor are normal, no rashes or significant lesions EYES: PERLA; Conjunctiva are pink and non-injected, sclera clear SINUSES: No redness  or tenderness over maxillary or ethmoid sinuses OROPHARYNX:no exudate, no erythema on lips, buccal mucosa, or tongue. NECK: supple, thyroid normal size, non-tender, without nodularity. No masses CHEST: Status post left mastectomy with no subcutaneous nodules. Radiotherapy fibrotic changes are noted. Right breast  with no masses. LYMPH:  no palpable lymphadenopathy in the cervical, axillary or inguinal LUNGS: clear to auscultation and percussion with normal breathing effort HEART: regular rate & rhythm and no murmurs. ABDOMEN:abdomen soft, non-tender and normal bowel sounds MUSCULOSKELETAL:no cyanosis of digits and no clubbing. Range of motion normal.  NEURO: alert & oriented x 3 with fluent speech, no focal motor/sensory deficits   LABORATORY DATA: Office Visit on 03/26/2014  Component Date Value Ref Range Status  . WBC 03/26/2014 6.8  4.0 - 10.5 K/uL Final  . RBC 03/26/2014 4.27  3.87 - 5.11 MIL/uL Final  . Hemoglobin 03/26/2014 12.7  12.0 - 15.0 g/dL Final  . HCT 03/26/2014 37.1  36.0 - 46.0 % Final  . MCV 03/26/2014 86.9  78.0 - 100.0 fL Final  . MCH 03/26/2014 29.7  26.0 - 34.0 pg Final  . MCHC 03/26/2014 34.2  30.0 - 36.0 g/dL Final  . RDW 03/26/2014 12.3  11.5 - 15.5 % Final  . Platelets 03/26/2014 222  150 - 400 K/uL Final  . Neutrophils Relative % 03/26/2014 67  43 - 77 % Final  . Neutro Abs 03/26/2014 4.6  1.7 - 7.7 K/uL Final  . Lymphocytes Relative 03/26/2014 20  12 - 46 % Final  . Lymphs Abs 03/26/2014 1.4  0.7 - 4.0 K/uL Final  . Monocytes Relative 03/26/2014 9  3 - 12 % Final  . Monocytes Absolute 03/26/2014 0.6  0.1 - 1.0 K/uL Final  . Eosinophils Relative 03/26/2014 3  0 - 5 % Final  . Eosinophils Absolute 03/26/2014 0.2  0.0 - 0.7 K/uL Final  . Basophils Relative 03/26/2014 1  0 - 1 % Final  . Basophils Absolute 03/26/2014 0.1  0.0 - 0.1 K/uL Final  . Sodium 03/26/2014 144  137 - 147 mEq/L Final  . Potassium 03/26/2014 3.8  3.7 - 5.3 mEq/L Final  . Chloride  03/26/2014 104  96 - 112 mEq/L Final  . CO2 03/26/2014 28  19 - 32 mEq/L Final  . Glucose, Bld 03/26/2014 114* 70 - 99 mg/dL Final  . BUN 03/26/2014 15  6 - 23 mg/dL Final  . Creatinine, Ser 03/26/2014 0.83  0.50 - 1.10 mg/dL Final  . Calcium 03/26/2014 9.1  8.4 - 10.5 mg/dL Final  . Total Protein 03/26/2014 7.3  6.0 - 8.3 g/dL Final  . Albumin 03/26/2014 3.8  3.5 - 5.2 g/dL Final  . AST 03/26/2014 18  0 - 37 U/L Final  . ALT 03/26/2014 14  0 - 35 U/L Final  . Alkaline Phosphatase 03/26/2014 56  39 - 117 U/L Final  . Total Bilirubin 03/26/2014 0.2* 0.3 - 1.2 mg/dL Final  . GFR calc non Af Amer 03/26/2014 79* >90 mL/min Final  . GFR calc Af Amer 03/26/2014 >90  >90 mL/min Final   Comment: (NOTE)                          The eGFR has been calculated using the CKD EPI equation.  This calculation has not been validated in all clinical situations.                          eGFR's persistently <90 mL/min signify possible Chronic Kidney                          Disease.  . CEA 03/26/2014 0.7  0.0 - 5.0 ng/mL Final   Performed at [facility]  . CA 27.29 03/26/2014 18  0 - 39 U/mL Final   Performed at [facility]  . Vit D, 25-Hydroxy 03/26/2014 48  30 - 89 ng/mL Final   Comment: (NOTE)                          This assay accurately quantifies Vitamin D, which is the sum of the                          25-Hydroxy forms of Vitamin D2 and D3.  Studies have shown that the                          optimum concentration of 25-Hydroxy Vitamin D is 30 ng/mL or higher.                           Concentrations of Vitamin D between 20 and 29 ng/mL are considered to                          be insufficient and concentrations less than 20 ng/mL are considered                          to be deficient for Vitamin D.                          Performed at [place]: No new pathology.  Urinalysis No results found for this basename:  colorurine,  appearanceur,  labspec,  phurine,  glucoseu,  hgbur,  bilirubinur,  ketonesur,  proteinur,  urobilinogen,  nitrite,  leukocytesur    RADIOGRAPHIC STUDIES: MM Digital Screening Unilat R Status: Final result            Study Result    *RADIOLOGY REPORT*  Clinical Data: Screening. Status post left mastectomy for breast  cancer in 2007, followed by radiation therapy and chemotherapy.  RIGHT DIGITAL SCREENING MAMMOGRAM WITH CAD  Comparison: Previous examinations.  FINDINGS:  ACR Breast Density Category 3: The breast tissue is heterogeneously  dense.  The patient has had a left mastectomy. No suspicious masses,  architectural distortion, or calcifications are present.  Images were processed with CAD.  IMPRESSION:  No evidence of malignancy. Screening mammography is recommended in  one year.  BI-RADS CATEGORY 1: Negative.  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 She had a biopsy proven subdermal metastasis picked up by radiology with biopsy. That is why  I felt she had stage IIIB disease. Her largest tumor nodules 1.5 cm and she took FEC x6 cycles in a dose dense fashion followed by radiation therapy. That was completed in June of 2008. We started her on tamoxifen on 03/20/2007 but switched her to Arimidex after she was having some intolerance issues. She was however found to have postmenopausal levels of FSH and LH. Tolerating tamoxifen at this time with hot flashes however. #2. BRCA status is being investigated.      PLAN:  #1. Repeat right mammogram. #2. Check on BRCA status. The patient is interested in obtaining that result if it has not been done. She does have 2 daughters. #3.  Continue tamoxifen after 10 years (2018 ). #4. Followup in one year with CBC, chem profile, CEA, CA 27-29, and vitamin D level.   All questions were answered. The patient knows to call the clinic with any problems, questions or concerns. We can certainly see the patient much sooner if necessary.   I spent 25 minutes counseling the patient face to face. The total time spent in the appointment was 30 minutes.    Doroteo Bradford, MD 03/27/2014 2:34 PM  DISCLAIMER:  This note was dictated with voice recognition software.  Similar sounding words can inadvertently be transcribed inaccurately and may not be corrected upon review.

## 2014-03-27 LAB — VITAMIN D 25 HYDROXY (VIT D DEFICIENCY, FRACTURES): VIT D 25 HYDROXY: 48 ng/mL (ref 30–89)

## 2014-03-27 LAB — CEA: CEA: 0.7 ng/mL (ref 0.0–5.0)

## 2014-03-27 LAB — CANCER ANTIGEN 27.29: CA 27.29: 18 U/mL (ref 0–39)

## 2014-03-30 ENCOUNTER — Telehealth: Payer: Self-pay | Admitting: *Deleted

## 2014-03-30 NOTE — Telephone Encounter (Signed)
Pt returned my call and I confirmed 04/22/14 genetic appt w/ pt.

## 2014-03-30 NOTE — Telephone Encounter (Signed)
Left message for pt to return my call so I can schedule a genetic appt.  

## 2014-04-22 ENCOUNTER — Other Ambulatory Visit: Payer: BC Managed Care – PPO

## 2014-07-23 ENCOUNTER — Other Ambulatory Visit: Payer: Self-pay

## 2014-07-23 DIAGNOSIS — Z1231 Encounter for screening mammogram for malignant neoplasm of breast: Secondary | ICD-10-CM

## 2014-07-23 DIAGNOSIS — Z9012 Acquired absence of left breast and nipple: Secondary | ICD-10-CM

## 2014-08-10 ENCOUNTER — Ambulatory Visit
Admission: RE | Admit: 2014-08-10 | Discharge: 2014-08-10 | Disposition: A | Discharge status: Home / Self Care | Payer: BC Managed Care – PPO | Source: Ambulatory Visit

## 2014-08-10 DIAGNOSIS — Z9012 Acquired absence of left breast and nipple: Secondary | ICD-10-CM

## 2014-08-10 DIAGNOSIS — Z1231 Encounter for screening mammogram for malignant neoplasm of breast: Secondary | ICD-10-CM

## 2014-09-16 ENCOUNTER — Other Ambulatory Visit (HOSPITAL_COMMUNITY): Payer: Self-pay | Admitting: Oncology

## 2015-03-10 ENCOUNTER — Other Ambulatory Visit (HOSPITAL_COMMUNITY): Payer: Self-pay

## 2015-03-10 DIAGNOSIS — C50919 Malignant neoplasm of unspecified site of unspecified female breast: Secondary | ICD-10-CM

## 2015-03-25 ENCOUNTER — Other Ambulatory Visit (HOSPITAL_COMMUNITY): Payer: BC Managed Care – PPO

## 2015-03-25 ENCOUNTER — Ambulatory Visit (HOSPITAL_COMMUNITY): Payer: BC Managed Care – PPO | Admitting: Hematology & Oncology

## 2015-04-11 NOTE — Progress Notes (Signed)
This encounter was created in error - please disregard.

## 2015-04-29 ENCOUNTER — Encounter (HOSPITAL_COMMUNITY): Payer: Self-pay | Admitting: Hematology & Oncology

## 2015-04-29 ENCOUNTER — Encounter (HOSPITAL_COMMUNITY): Payer: BC Managed Care – PPO | Attending: Hematology & Oncology | Admitting: Hematology & Oncology

## 2015-04-29 ENCOUNTER — Encounter (HOSPITAL_COMMUNITY): Payer: BC Managed Care – PPO | Attending: Oncology

## 2015-04-29 VITALS — BP 128/83 | HR 104 | Temp 97.8°F | Resp 14 | Wt 165.8 lb

## 2015-04-29 DIAGNOSIS — C50912 Malignant neoplasm of unspecified site of left female breast: Secondary | ICD-10-CM

## 2015-04-29 DIAGNOSIS — C50919 Malignant neoplasm of unspecified site of unspecified female breast: Secondary | ICD-10-CM

## 2015-04-29 DIAGNOSIS — Z79811 Long term (current) use of aromatase inhibitors: Secondary | ICD-10-CM

## 2015-04-29 LAB — COMPREHENSIVE METABOLIC PANEL
ALK PHOS: 49 U/L (ref 38–126)
ALT: 18 U/L (ref 14–54)
AST: 23 U/L (ref 15–41)
Albumin: 4.3 g/dL (ref 3.5–5.0)
Anion gap: 7 (ref 5–15)
BUN: 10 mg/dL (ref 6–20)
CALCIUM: 9.1 mg/dL (ref 8.9–10.3)
CO2: 26 mmol/L (ref 22–32)
Chloride: 106 mmol/L (ref 101–111)
Creatinine, Ser: 0.9 mg/dL (ref 0.44–1.00)
GFR calc non Af Amer: >60 mL/min (ref >60)
GLUCOSE: 134 mg/dL — AB (ref 65–99)
Potassium: 3.6 mmol/L (ref 3.5–5.1)
SODIUM: 139 mmol/L (ref 135–145)
Total Bilirubin: 0.4 mg/dL (ref 0.3–1.2)
Total Protein: 7.6 g/dL (ref 6.5–8.1)

## 2015-04-29 LAB — CBC WITH DIFFERENTIAL/PLATELET
BASOS PCT: 1 % (ref 0–1)
Basophils Absolute: 0.1 10*3/uL (ref 0.0–0.1)
EOS ABS: 0.1 10*3/uL (ref 0.0–0.7)
Eosinophils Relative: 2 % (ref 0–5)
HEMATOCRIT: 38.3 % (ref 36.0–46.0)
Hemoglobin: 12.5 g/dL (ref 12.0–15.0)
LYMPHS PCT: 24 % (ref 12–46)
Lymphs Abs: 2 10*3/uL (ref 0.7–4.0)
MCH: 28.8 pg (ref 26.0–34.0)
MCHC: 32.6 g/dL (ref 30.0–36.0)
MCV: 88.2 fL (ref 78.0–100.0)
Monocytes Absolute: 0.5 10*3/uL (ref 0.1–1.0)
Monocytes Relative: 6 % (ref 3–12)
Neutro Abs: 5.5 10*3/uL (ref 1.7–7.7)
Neutrophils Relative %: 67 % (ref 43–77)
PLATELETS: 214 10*3/uL (ref 150–400)
RBC: 4.34 MIL/uL (ref 3.87–5.11)
RDW: 12.2 % (ref 11.5–15.5)
WBC: 8.2 10*3/uL (ref 4.0–10.5)

## 2015-04-29 NOTE — Patient Instructions (Signed)
[place] at [facility] Discharge Instructions  RECOMMENDATIONS MADE BY THE CONSULTANT AND ANY TEST RESULTS WILL BE SENT TO YOUR REFERRING PHYSICIAN.  Exam and discussion by Dr. Whitney Muse We will get you scheduled for genetic counseling Report any new lumps, bone pain, shortness of breath or other symptoms.  Follow-up in 6 months.  Thank you for choosing [place] at [facility] to provide your oncology and hematology care.  To afford each patient quality time with our provider, please arrive at least 15 minutes before your scheduled appointment time.    You need to re-schedule your appointment should you arrive 10 or more minutes late.  We strive to give you quality time with our providers, and arriving late affects you and other patients whose appointments are after yours.  Also, if you no show three or more times for appointments you may be dismissed from the clinic at the providers discretion.     Again, thank you for choosing [facility].  Our hope is that these requests will decrease the amount of time that you wait before being seen by our physicians.       _____________________________________________________________  Should you have questions after your visit to [facility], please contact our office at [phone] between the hours of 8:30 a.m. and 4:30 p.m.  Voicemails left after 4:30 p.m. will not be returned until the following business day.  For prescription refill requests, have your pharmacy contact our office.

## 2015-04-29 NOTE — Progress Notes (Signed)
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 female is here because of a history of Stage III Breast cancer.   The patient says that over all she is doing well.  [place].  She does however have terrible hot flashes that sometimes keep her up at night.  She used to take Effexor but did not notice that it was helping at all.  She now just takes Tylenol PM.  The patient also has complaints of a "knot" underneath her right arm that was "caused by her lifting objects."  She had her mastectomy done at [facility].  She is currently taking Tamoxifen and says that she has no issues with it. She started tamoxifen on 03/20/2007.  The patient does do self breast examinations.  She was the first to find her cancer.  She takes Calcium and Vit. D.  She also exercises once or twice a week   The patient had a colonoscopy done in 2011 by Dr. Laural Golden.  She has had a hysterectomy.  She still has her ovaries. The patient has had a bone density test .  She has never met with a Dietitian.  She does not have family history of breast cancer. The has 1 sister who has never had problems in her chest.  She has 2 daughters.   MEDICAL HISTORY:  Past Medical History  Diagnosis Date  . 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 Laparoscopy      endometrosis  . Lt. mastectomy    . Hysterotomy      1 ovary remains  . Portacath placement    . Port wine stain removal w/ laser      SOCIAL HISTORY: Social History   Social History  . Marital Status: Married    Spouse Name: N/A  . Number of Children: N/A  . Years of Education: N/A   Occupational History  . Not on file.   Social History Main Topics  . Smoking status: Never Smoker   . Smokeless tobacco: Never Used  . Alcohol Use: Yes     Comment: very occ  . Drug Use: No  . Sexual Activity: Not on file   Other Topics Concern  . Not on file   Social History Narrative    FAMILY HISTORY: Family History  Problem Relation Age of Onset  . Anesthesia problems Neg Hx   . Alzheimer's disease Father   . Alzheimer's disease Maternal Grandmother    indicated that her mother is alive. She indicated that her father is deceased. She indicated that her sister is alive. She indicated that her maternal grandmother is deceased. She indicated that her maternal grandfather is deceased. She indicated that her paternal grandmother is deceased. She indicated that her paternal grandfather is deceased. She indicated that her daughter is alive.  She indicated that her maternal aunt is alive. She indicated that only one of her two paternal aunts is alive. She indicated that her paternal uncle is alive.   ALLERGIES:  is allergic to codeine and morphine and related.  MEDICATIONS:  Current Outpatient Prescriptions  Medication Sig Dispense Refill  . Calcium Carb-Cholecalciferol (CALTRATE 600+D) 600-800 MG-UNIT TABS Take 1 tablet by mouth 2 (two) times daily.    . diphenhydramine-acetaminophen (TYLENOL PM EXTRA STRENGTH) 25-500 MG TABS Take 1 tablet by mouth at bedtime as needed.    . Multiple Vitamin (MULTIVITAMIN) tablet Take 1 tablet by mouth daily.      . tamoxifen (NOLVADEX) 20 MG tablet TAKE 1 TABLET BY MOUTH  DAILY. 30 tablet 5  . venlafaxine (EFFEXOR) 37.5 MG tablet Take 1 tablet (37.5 mg total) by mouth 2 (two) times daily. (Patient not taking: Reported on 04/29/2015) 60 tablet 3   No current facility-administered medications for this visit.    Review of Systems  All other systems reviewed and are negative.  14 point ROS was done and is otherwise as detailed above or in HPI  PHYSICAL EXAMINATION: ECOG PERFORMANCE STATUS: 0 - Asymptomatic  Filed Vitals:   04/29/15 1403  BP: 128/83  Pulse: 104  Temp: 97.8 F (36.6 C)  Resp: 14   Filed Weights   04/29/15 1403  Weight: 165 lb 12.8 oz (75.206 kg)     Physical Exam  Constitutional: She is oriented to person, place, and time and well-developed, well-nourished, and in no distress.  Bruising under right arm.  HENT:  Head: Normocephalic and atraumatic.  Nose: Nose normal.  Mouth/Throat: Oropharynx is clear and moist. No oropharyngeal exudate.  Eyes: Conjunctivae and EOM are normal. Pupils are equal, round, and reactive to light. Right eye exhibits no discharge. Left eye exhibits no discharge. No scleral icterus.  Neck: Normal range of motion. Neck supple. No tracheal deviation present. No thyromegaly present.  Cardiovascular: Normal rate, regular rhythm and normal heart sounds.  Exam reveals no gallop and no friction rub.   No murmur heard. Pulmonary/Chest: Effort normal and breath sounds normal. She has no wheezes. She has no rales.    Abdominal: Soft. Bowel sounds are normal. She exhibits no distension and no mass. There is no tenderness. There is no rebound and no guarding.  Musculoskeletal: Normal range of motion. She exhibits no edema.  Lymphadenopathy:    She has no cervical adenopathy.  Neurological: She is alert and oriented to person, place, and time. She has normal reflexes. No cranial nerve deficit. Gait normal. Coordination normal.  Skin: Skin is warm and dry. No rash noted.  Psychiatric: Mood, memory, affect and  judgment normal.  Nursing note and vitals reviewed.    LABORATORY DATA:  I have reviewed the data as listed Lab Results  Component Value Date   WBC 8.2 04/29/2015   HGB 12.5 04/29/2015   HCT 38.3 04/29/2015   MCV 88.2 04/29/2015   PLT 214 04/29/2015   CMP     Component Value Date/Time   NA 139 04/29/2015 1418   K 3.6 04/29/2015 1418   CL 106 04/29/2015 1418   CO2 26 04/29/2015 1418   GLUCOSE 134* 04/29/2015 1418   BUN 10 04/29/2015 1418   CREATININE 0.90 04/29/2015 1418   CALCIUM 9.1 04/29/2015 1418   PROT 7.6 04/29/2015 1418   ALBUMIN 4.3 04/29/2015 1418   AST 23 04/29/2015 1418   ALT 18 04/29/2015 1418   ALKPHOS 49 04/29/2015 1418   BILITOT  0.4 04/29/2015 1418   GFRNONAA >60 04/29/2015 1418   GFRAA >60 04/29/2015 1418     RADIOGRAPHIC STUDIES: I have personally reviewed the radiological images as listed and agreed with the findings in the report. CLINICAL DATA: Screening.  EXAM: DIGITAL SCREENING UNILATERAL RIGHT MAMMOGRAM WITH TOMO AND CAD  COMPARISON: Previous exam(s).  ACR Breast Density Category b: There are scattered areas of fibroglandular density.  FINDINGS: There are no findings suspicious for malignancy. Images were processed with CAD.  IMPRESSION: No mammographic evidence of malignancy. A result letter of this screening mammogram will be mailed directly to the patient.  RECOMMENDATION: Screening mammogram in one year. (Code:SM-B-01Y)  BI-RADS CATEGORY 1: Negative.   Electronically Signed  By: Lillia Mountain M.D.  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 She has 2 daughters. She would like to do her testing here at [facility] if  possible and we will place her on the schedule for genetic counseling at the next available appointment.  Per available records she is post menopausal by hormone levels and tried arimidex at one point. She states she has better tolerance to tamoxifen and has opted to complete 10 years of therapy.  We will see her back in 6 months with repeat physical exam. She will be due for mammography in November. We have ordered this for her today.  I have examined the area under her arm and it is consistent with a bruise. I advised her if the area does not improve or worsens to let us know. Otherwise plan as detailed above.   Orders Placed This Encounter  Procedures  . MM Digital Screening Unilat R    Standing Status: Future     Number of Occurrences:      Standing Expiration Date: 04/28/2016    Order Specific Question:  Reason for Exam (SYMPTOM  OR DIAGNOSIS REQUIRED)    Answer:  screening, history of breast cancer    Order Specific Question:  Is the patient pregnant?    Answer:  No    Order Specific Question:  Preferred imaging location?    Answer:  [facility]    All questions were answered. The patient knows to call the clinic with any problems, questions or concerns.   This document serves as a record of services personally performed by Ancil Linsey, MD. It was created on her behalf by Janace Hoard, a trained medical scribe. The creation of this record is based on the scribe's personal observations and the provider's statements to them. This document has been checked and approved by the attending provider.  I have reviewed the above documentation for accuracy and completeness, and I agree with the above.  This note was electronically signed.  Kelby Fam. Whitney Muse, MD

## 2015-04-30 LAB — VITAMIN D 25 HYDROXY (VIT D DEFICIENCY, FRACTURES): Vit D, 25-Hydroxy: 32.9 ng/mL (ref 30.0–100.0)

## 2015-04-30 LAB — CANCER ANTIGEN 27.29: CA 27.29: 11.6 U/mL (ref 0.0–38.6)

## 2015-05-02 NOTE — Progress Notes (Signed)
Rachel Harrell presented for [employer]. Labs per MD order drawn via Peripheral Line 23 gauge needle inserted in right AC  Good blood return present. Procedure without incident.  Needle removed intact. Patient tolerated procedure well.

## 2015-05-03 ENCOUNTER — Encounter (HOSPITAL_COMMUNITY): Payer: BC Managed Care – PPO | Admitting: Genetic Counselor

## 2015-05-09 ENCOUNTER — Encounter (HOSPITAL_COMMUNITY): Payer: BC Managed Care – PPO | Attending: Genetic Counselor | Admitting: Genetic Counselor

## 2015-05-09 ENCOUNTER — Encounter (HOSPITAL_COMMUNITY): Payer: Self-pay | Admitting: Genetic Counselor

## 2015-05-09 DIAGNOSIS — Z853 Personal history of malignant neoplasm of breast: Secondary | ICD-10-CM | POA: Diagnosis not present

## 2015-05-09 DIAGNOSIS — Z315 Encounter for genetic counseling: Secondary | ICD-10-CM | POA: Diagnosis not present

## 2015-05-09 NOTE — Progress Notes (Signed)
REFERRING PROVIDER: Ancil Linsey, MD  PRIMARY PROVIDER:  Glenda Chroman., MD  PRIMARY REASON FOR VISIT:  1. History of breast cancer in female      HISTORY OF PRESENT ILLNESS:   Rachel Harrell, a 54 y.o. female, was seen for a [place] cancer genetics consultation at the request of Dr. Whitney Muse due to a personal history of breast cancer diagnosed at 39.  [name] presents to clinic today to discuss the possibility of a hereditary predisposition to cancer, genetic testing, and to further clarify her future cancer risks, as well as potential cancer risks for family members.   In 2007, at the age of 69, Rachel Harrell was diagnosed with invasive ductal carcinoma and DCIS of the left breast. Hormone receptor status was ER/PR+, Her2-.  This was treated with left mastectomy.    CANCER HISTORY:   No history exists.  2007 - IDC + DCIS of left breast; ER/PR+, Her2-   HORMONAL RISK FACTORS:  Menarche was at age 58.  First live birth at age 60.  OCP use for approximately 15-20 years.  Ovaries intact: one has been removed, one remains.  Hysterectomy: yes.  Menopausal status: postmenopausal.  HRT use: 0 years. Colonoscopy: yes; normal. Mammogram within the last year: yes. Number of breast biopsies: 1. Up to date with pelvic exams:  yes. Any excessive radiation exposure in the past:  no  Past Medical History  Diagnosis Date  . 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 Breast cancer 2007    IDC+DCIS of L breast; ER/PR+, Her2-    Past Surgical History  Procedure Laterality Date  . Laparoscopy      endometrosis  . Lt. mastectomy    . Hysterotomy      1 ovary remains  . Portacath placement    . Port wine stain removal w/ laser      History   Social History  . Marital Status: Married    Spouse Name: N/A  . Number of Children: N/A  . Years of Education: N/A   Social History Main Topics  . Smoking status: Never Smoker   . Smokeless tobacco: Never Used  . Alcohol  Use: Yes     Comment: very occ  . Drug Use: No  . Sexual Activity: Not on file   Other Topics Concern  . Not on file   Social History Narrative     FAMILY HISTORY:  We obtained a detailed, 4-generation family history.  Significant diagnoses are listed below: Family History  Problem Relation Age of Onset  . Anesthesia problems Neg Hx   . Alzheimer's disease Father   . Alzheimer's disease Maternal Grandmother     Rachel Harrell has daughters, ages 70 and 21, for whom she is most interested in genetic testing.  She has one full sister, age 26, who is cancer-free.  Her mother is alive and cancer-free at 43; her father passed away due to Alzheimer's at the age of 59.  There is no known family history of cancer.  Rachel Harrell has one maternal aunt, age 26 who is alive and cancer-free.  This aunt has three sons in their late 62s-early 84s who are cancer-free.  Her maternal grandmother died of Alzheimer's at 33, and her maternal grandfather passed away young while in [place].    Rachel Harrell father had two full sisters and three full brothers.  One sister died in her late 43s, the cause of which Rachel Harrell was unaware.  This aunt had  two daughters, but Rachel Harrell has no further information on these or any of her additional maternal first cousins.  The other maternal aunt is alive and cancer-free at 30.  All three maternal uncles are still living--one is 54 and the other two are currently in their early 10s.  [name] maternal grandmother died at 3 and her grandfather died at 33.      Patient's maternal ancestors are of [place] and [place] descent, and paternal ancestors are of [place] descent. There is no reported Ashkenazi Jewish ancestry. There is no known consanguinity.  GENETIC COUNSELING ASSESSMENT: Rachel Harrell is a 54 y.o. female with a personal history of breast cancer, diagnosed at 51 which is perhaps somewhat suggestive of a hereditary breast cancer syndrome and predisposition to cancer. We,  therefore, discussed and recommended the following at today's visit.   DISCUSSION: We reviewed the characteristics, features and inheritance patterns of hereditary cancer syndromes, particularly those caused by changes within the BRCA1/2 genes. We also discussed genetic testing, including the appropriate family members to test, the process of testing, insurance coverage and turn-around-time for results. We discussed the implications of a negative, positive and/or variant of uncertain significant result. We discussed that Rachel Harrell falls just outside of the age cut off for meeting guidelines for genetic testing (although, from her record, it appears that she might have been diagnosed a couple of weeks after her 45th birthday).  Though we reviewed that the negative family history of cancer is very reassuring, despite that there is some limited information for paternal cousins, she is still concerned about genetic cancer risks.  She feels that she would still like to have this information, particularly for the benefit of her two daughters.    We discussed that insurance may not cover the cost of testing.  With that caveat, we recommended Rachel Harrell pursue genetic testing for the [employer] through [employer] [facility] [place] [place]  If this test will not be covered by insurance, we can try other genetic testing options that do not bill through insurance and, thus, do not need to fit specific criteria for testing.  Two labs/genetic testing products that have affordable self-pay options are [facility] and [facility].    We discussed that, submitting the blood sample through [facility], if her out of pocket cost for testing is over $100, the laboratory will call and confirm whether she wants to proceed with testing.  If the out of pocket cost of testing is less than $100 she will be billed by the genetic testing laboratory.   PLAN: After considering the risks, benefits, and  limitations, Rachel Harrell decided to speak with her husband before further pursuing genetic testing.  In the meantime, she would like more information on the Genetic Information Non-discrimination Act, which I am happy to send to her.   We discussed that, if she is still interested, we could coordinate the blood draw and consent form-signing for the genetic testing at a future agreed-upon date.  If she decides to proceed with testing, results would be available by approximately 3 weeks from the draw date.  We would call Rachel Harrell with her results, and those results would also be available in Epic. We encouraged Rachel Harrell to remain in contact with cancer genetics annually so that we can continuously update the family history and inform her of any changes in cancer genetics and testing that may be of benefit for her family. Ms. Hieronymus questions were answered to  her satisfaction today. Our contact information was provided should additional questions or concerns arise.  She will let us know when she makes a decision about genetic testing.   Thank you for the referral and allowing [place] to share in the care of your patient.   Jeanine Luz, MS Genetic Counselor [email or web address] Phone: [phone]  The patient was seen for a total of 45 minutes in face-to-face genetic counseling.  This patient was discussed with Drs. Magrinat, Lindi Adie and/or Burr Medico who agrees with the above.    _______________________________________________________________________ For Office Staff:  Number of people involved in session: 1 Was an Intern/ student involved with case: no

## 2015-05-17 ENCOUNTER — Encounter (HOSPITAL_COMMUNITY): Payer: Self-pay

## 2015-07-20 ENCOUNTER — Other Ambulatory Visit (HOSPITAL_COMMUNITY): Payer: Self-pay | Admitting: Oncology

## 2015-08-15 ENCOUNTER — Ambulatory Visit (HOSPITAL_COMMUNITY): Payer: BC Managed Care – PPO

## 2015-08-29 ENCOUNTER — Other Ambulatory Visit (HOSPITAL_COMMUNITY): Payer: Self-pay | Admitting: Hematology & Oncology

## 2015-08-29 ENCOUNTER — Ambulatory Visit (HOSPITAL_COMMUNITY)
Admission: RE | Admit: 2015-08-29 | Discharge: 2015-08-29 | Disposition: A | Discharge status: Home / Self Care | Payer: BC Managed Care – PPO | Source: Ambulatory Visit | Attending: Hematology & Oncology | Admitting: Hematology & Oncology

## 2015-08-29 ENCOUNTER — Ambulatory Visit (HOSPITAL_COMMUNITY): Payer: BC Managed Care – PPO

## 2015-08-29 DIAGNOSIS — Z1231 Encounter for screening mammogram for malignant neoplasm of breast: Secondary | ICD-10-CM

## 2015-11-02 ENCOUNTER — Encounter (HOSPITAL_COMMUNITY): Payer: BC Managed Care – PPO | Attending: Hematology & Oncology | Admitting: Hematology & Oncology

## 2015-11-02 DIAGNOSIS — C7989 Secondary malignant neoplasm of other specified sites: Secondary | ICD-10-CM

## 2015-11-02 DIAGNOSIS — C50912 Malignant neoplasm of unspecified site of left female breast: Secondary | ICD-10-CM

## 2015-11-02 DIAGNOSIS — C50919 Malignant neoplasm of unspecified site of unspecified female breast: Secondary | ICD-10-CM | POA: Diagnosis not present

## 2015-11-02 NOTE — Patient Instructions (Addendum)
[place] at [facility] Discharge Instructions  RECOMMENDATIONS MADE BY THE CONSULTANT AND ANY TEST RESULTS WILL BE SENT TO YOUR REFERRING PHYSICIAN.     Exam and discussion completed by Dr Whitney Muse today Next time we will do a clinical breast exam Continue taking tamoxifen as prescribed  Return to see the doctor in 6 months with labs Referral with genetics but with karen this time Please call the clinic if you have any questions or concerns     Thank you for choosing [place] at [facility] to provide your oncology and hematology care.  To afford each patient quality time with our provider, please arrive at least 15 minutes before your scheduled appointment time.   Beginning January 23rd 2017 lab work for the [employer] [facility] [employer] will be done in the  Main lab at [facility] on 1st floor. If you have a lab appointment with the [place] please come in thru the  Main Entrance and check in at the main information desk  You need to re-schedule your appointment should you arrive 10 or more minutes late.  We strive to give you quality time with our providers, and arriving late affects you and other patients whose appointments are after yours.  Also, if you no show three or more times for appointments you may be dismissed from the clinic at the providers discretion.     Again, thank you for choosing [facility].  Our hope is that these requests will decrease the amount of time that you wait before being seen by our physicians.       _____________________________________________________________  Should you have questions after your visit to [facility], please contact our office at [phone] between the hours of 8:30 a.m. and 4:30 p.m.  Voicemails left after 4:30 p.m. will not be returned until the following business day.  For prescription refill requests, have your pharmacy contact our office.

## 2015-11-02 NOTE — Progress Notes (Signed)
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 female is here because of a history of Stage III Breast cancer.   Rachel Harrell returns to the [facility] alone today.   She says everything's been good, her energy has been good, and that the Tamoxifen has been fine. She says she still has hot flashes at night, but reports that "they're fine" and "I deal with them."  She had her mammogram in November and will be due for a breast exam the next time she comes in to the [facility] for follow-up.  She did not do genetic testing. She says she's worried about the age of her daughters, 110 and 40, and how the news might affect them if she does test positive. She was avoiding the test because she wasn't sure how to address the results if they were "not good. She is open to meeting with Santiago Glad the next time she comes up to [facility].  Other than that, Rachel Harrell says there's nothing she's concerned or worried about. She says her appetite is good, and denies any problems with her bowels or any problems urinating.  She says she wears mastectomy bras and would like a new prescription for a bra prosthesis.  She says that her Christmas was very good.  She confirms that her PCP follows her cholesterol and that she has had no problems there.   MEDICAL HISTORY:  Past Medical History  Diagnosis Date  . Invasive ductal carcinoma of left breast  03/28/2011  . Breast cancer 2007    IDC+DCIS of L breast; ER/PR+, Her2-    SURGICAL HISTORY: Past Surgical History  Procedure Laterality Date  . Laparoscopy      endometrosis  . Lt. mastectomy    . Hysterotomy      1 ovary remains  . Portacath placement    . Port wine stain removal w/ laser      SOCIAL HISTORY: Social History   Social History  . Marital Status: Married    Spouse Name: N/A  . Number of Children: N/A  . Years of Education: N/A   Occupational History  . Not on file.   Social History Main Topics  . Smoking status: Never Smoker   . Smokeless tobacco: Never Used  . Alcohol Use: Yes     Comment: very occ  . Drug Use: No  . Sexual Activity: Not on file   Other Topics Concern  . Not on file   Social History Narrative    FAMILY HISTORY: Family History  Problem Relation Age of Onset  . Anesthesia problems Neg Hx   . Alzheimer's disease Father   . Alzheimer's disease Maternal Grandmother    indicated that her mother is alive. She indicated that her father is deceased. She indicated that her sister is alive. She indicated that her maternal grandmother is deceased. She indicated that her maternal grandfather is deceased. She indicated that her paternal grandmother is deceased. She indicated that her paternal grandfather is deceased. She indicated that  her daughter is alive. She indicated that her maternal aunt is alive. She indicated that only one of her two paternal aunts is alive. She indicated that her paternal uncle is alive.   ALLERGIES:  is allergic to codeine and morphine and related.  MEDICATIONS:  Current Outpatient Prescriptions  Medication Sig Dispense Refill  . Calcium Carb-Cholecalciferol (CALTRATE 600+D) 600-800 MG-UNIT TABS Take 1 tablet by mouth 2 (two) times daily.    . diphenhydramine-acetaminophen (TYLENOL PM EXTRA STRENGTH) 25-500 MG TABS Take 1 tablet by mouth at bedtime as needed.    . Multiple Vitamin (MULTIVITAMIN) tablet Take 1  tablet by mouth daily.      . tamoxifen (NOLVADEX) 20 MG tablet TAKE 1 TABLET BY MOUTH DAILY. 30 tablet 5  . venlafaxine (EFFEXOR) 37.5 MG tablet Take 1 tablet (37.5 mg total) by mouth 2 (two) times daily. (Patient not taking: Reported on 04/29/2015) 60 tablet 3   No current facility-administered medications for this visit.    Review of Systems  All other systems reviewed and are negative.  14 point ROS was done and is otherwise as detailed above or in HPI   PHYSICAL EXAMINATION: ECOG PERFORMANCE STATUS: 0 - Asymptomatic   Physical Exam  Constitutional: She is oriented to person, place, and time and well-developed, well-nourished, and in no distress.  HENT:  Head: Normocephalic and atraumatic.  Nose: Nose normal.  Mouth/Throat: Oropharynx is clear and moist. No oropharyngeal exudate.  Eyes: Conjunctivae and EOM are normal. Pupils are equal, round, and reactive to light. Right eye exhibits no discharge. Left eye exhibits no discharge. No scleral icterus.  Neck: Normal range of motion. Neck supple. No tracheal deviation present. No thyromegaly present.  Cardiovascular: Normal rate, regular rhythm and normal heart sounds.  Exam reveals no gallop and no friction rub.   No murmur heard. Pulmonary/Chest: Effort normal and breath sounds normal. She has no wheezes. She has no rales.  Abdominal: Soft. Bowel sounds are normal. She exhibits no distension and no mass. There is no tenderness. There is no rebound and no guarding.  Musculoskeletal: Normal range of motion. She exhibits no edema.  Lymphadenopathy:    She has no cervical adenopathy.  Neurological: She is alert and oriented to person, place, and time. She has normal reflexes. No cranial nerve deficit. Gait normal. Coordination normal.  Skin: Skin is warm and dry. No rash noted.  Psychiatric: Mood, memory, affect and judgment normal.  Nursing note and vitals reviewed.    LABORATORY DATA:  I have reviewed the data as listed Lab  Results  Component Value Date   WBC 8.2 04/29/2015   HGB 12.5 04/29/2015   HCT 38.3 04/29/2015   MCV 88.2 04/29/2015   PLT 214 04/29/2015   CMP     Component Value Date/Time   NA 139 04/29/2015 1418   K 3.6 04/29/2015 1418   CL 106 04/29/2015 1418   CO2 26 04/29/2015 1418   GLUCOSE 134* 04/29/2015 1418   BUN 10 04/29/2015 1418   CREATININE 0.90 04/29/2015 1418   CALCIUM 9.1 04/29/2015 1418   PROT 7.6 04/29/2015 1418   ALBUMIN 4.3 04/29/2015 1418   AST 23 04/29/2015 1418   ALT 18 04/29/2015 1418   ALKPHOS 49 04/29/2015 1418   BILITOT 0.4 04/29/2015 1418   GFRNONAA >60 04/29/2015 1418   GFRAA >60 04/29/2015 1418     RADIOGRAPHIC STUDIES: I have personally reviewed the radiological images as listed and agreed with the findings in the report.  Study Result  CLINICAL DATA: Screening.  EXAM: DIGITAL SCREENING UNILATERAL RIGHT MAMMOGRAM WITH TOMO AND CAD  COMPARISON: Previous exam(s).  ACR Breast Density Category b: There are scattered areas of fibroglandular density.  FINDINGS: The patient has had a left mastectomy. There are no findings suspicious for malignancy. Images were processed with CAD.  IMPRESSION: No mammographic evidence of malignancy. A result letter of this screening mammogram will be mailed directly to the patient.  RECOMMENDATION: Screening mammogram in one year. (Code:SM-R-31M)  BI-RADS CATEGORY 1: Negative.   Electronically Signed  By: Ammie Ferrier M.D.  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 She states she has better tolerance to tamoxifen  and has opted to complete 10 years of therapy.  I advised Rachel Harrell that Santiago Glad comes up to [facility] for genetic counseling once a month, and that she should speak with Santiago Glad as a resource to alleviate her fears of genetic counseling regarding her daughters.  I have written a prescription for a mastectomy bra & prosthesis for Rachel Harrell.  She will return in 6 months with a breast exam.   Orders Placed This Encounter  Procedures  . CBC with Differential    Standing Status: Future     Number of Occurrences:      Standing Expiration Date: 11/01/2016  . Comprehensive metabolic panel    Standing Status: Future     Number of Occurrences:      Standing Expiration Date: 11/01/2016  . Cancer antigen 27.29    Standing Status: Future     Number of Occurrences:      Standing Expiration Date: 11/01/2016  . Vitamin D 25 hydroxy    Standing Status: Future     Number of Occurrences:      Standing Expiration Date: 11/01/2016    All questions were answered. The patient knows to call the clinic with any problems, questions or concerns.   This document serves as a record of services personally performed by Ancil Linsey, MD. It was created on her behalf by Toni Amend, a trained medical scribe. The creation of this record is based on the scribe's personal observations and the provider's statements to them. This document has been checked and approved by the attending provider.  I have reviewed the above documentation for accuracy and completeness, and I agree with the above.  This note was electronically signed.  Kelby Fam. Whitney Muse, MD

## 2015-11-07 ENCOUNTER — Encounter (HOSPITAL_COMMUNITY): Payer: Self-pay | Admitting: Hematology & Oncology

## 2015-12-15 ENCOUNTER — Encounter (HOSPITAL_COMMUNITY): Payer: BC Managed Care – PPO

## 2015-12-15 ENCOUNTER — Encounter (HOSPITAL_COMMUNITY): Payer: BC Managed Care – PPO | Attending: Hematology & Oncology | Admitting: Genetic Counselor

## 2015-12-15 ENCOUNTER — Encounter (HOSPITAL_COMMUNITY): Payer: Self-pay | Admitting: Genetic Counselor

## 2015-12-15 DIAGNOSIS — C50912 Malignant neoplasm of unspecified site of left female breast: Secondary | ICD-10-CM

## 2015-12-15 DIAGNOSIS — Z315 Encounter for genetic counseling: Secondary | ICD-10-CM

## 2015-12-15 NOTE — Progress Notes (Addendum)
REFERRING PROVIDER: Glenda Chroman, MD [place], [place] [address]   Ancil Linsey, MD  PRIMARY PROVIDER:  Glenda Harrell., MD  PRIMARY REASON FOR VISIT:  1. Invasive ductal carcinoma of breast, left (HCC)      HISTORY OF PRESENT ILLNESS:   Rachel Harrell, a 55 y.o. female, was seen for a [place] cancer genetics consultation at the request of Dr. Whitney Muse due to a personal history of cancer.  Rachel Harrell presents to clinic today to discuss the possibility of a hereditary predisposition to cancer, genetic testing, and to further clarify her future cancer risks, as well as potential cancer risks for family members.   In 2007, at the age of 46, Rachel Harrell was diagnosed with invasive ductal carcinoma of the breast. Rachel Harrell was seen by Jeanine Luz, MS, [place] in August 2016 for genetic counseling and testing.  At that time she decided against genetic testing.  Rachel Harrell is here to discuss her concerns about genetic discrimination and undergo genetic testing. Please see Rachel Harrell's clinic note from May 09, 2015 for more information.     CANCER HISTORY:   No history exists.     Past Medical History  Diagnosis Date  . 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 Breast cancer ([place]) 2007    IDC+DCIS of L breast; ER/PR+, Her2-    Past Surgical History  Procedure Laterality Date  . Laparoscopy      endometrosis  . Lt. mastectomy    . Hysterotomy      1 ovary remains  . Portacath placement    . Port wine stain removal w/ laser      Social History   Social History  . Marital Status: Married    Spouse Name: N/A  . Number of Children: N/A  . Years of Education: N/A   Social History Main Topics  . Smoking status: Never Smoker   . Smokeless tobacco: Never Used  . Alcohol Use: Yes     Comment: very occ  . Drug Use: No  . Sexual Activity: Not Asked   Other Topics Concern  . None   Social History Narrative     FAMILY HISTORY:  We obtained a detailed,  4-generation family history.  Significant diagnoses are listed below: Family History  Problem Relation Age of Onset  . Anesthesia problems Neg Hx   . Alzheimer's disease Father   . Alzheimer's disease Maternal Grandmother     Rachel Harrell has daughters, ages 15 and 51, for whom she is most interested in genetic testing. She has one full sister, age 17, who is cancer-free. Her mother is alive and cancer-free at 68; her father passed away due to Alzheimer's at the age of 12. Rachel Harrell has one maternal aunt, age 56 who is alive and cancer-free. This aunt has three sons in their late 72s-early 59s who are cancer-free. Her maternal grandmother died of Alzheimer's at 34, and her maternal grandfather passed away young while in [place]. Rachel Harrell father had two full sisters and three full brothers. One sister died in her late 16s, the cause of which Rachel Harrell was unaware. This aunt had two daughters, but Rachel Harrell has no further information on these or any of her additional maternal first cousins. The other maternal aunt is alive and cancer-free at 85. All three maternal uncles are still living--one is 60 and the other two are currently in their early 12s. Rachel Harrell maternal grandmother died at 62 and  her grandfather died at 68.Patient's maternal ancestors are of [place] and [place] descent, and paternal ancestors are of [place] descent. There is no reported Ashkenazi Jewish ancestry. There is no known consanguinity.  GENETIC COUNSELING ASSESSMENT: Rachel Harrell is a 55 y.o. female with a personal history of breast cancer which is somewhat suggestive of a hereditary cancer syndrome and predisposition to cancer. We, therefore, discussed and recommended the following at today's visit.   DISCUSSION: We discussed that about 5-10% of breast cancer is hereditary, with the most common cause being BRCA mutations.  Rachel Harrell is worried about genetic discrimination for her daughters if she undergoes testing.   We discussed the current laws on the books that pertain to genetic discrimination.  Currently, most states have basic laws protecting their citizens against insurance discrimination. However, in 2008 a federal law called Genetic Information Non-Discrimination Act (GINA) went into place that provided protection against [employer] and employment genetic discrimination.  It does not provide protections against supplemental insurance such as life, disability or Harrell term care insurance.   We also discussed the Affordable Care Act (ACA), that provided additional protections against pre-existing conditions.  With the ACA under attack, and not knowing what will be put in its place, we can rely on the protections of GINA.  We also discussed the willingness of insurance companies to test patient's, as they would rather cover preventive care than cancer care.  Rachel Harrell voiced her understanding of this information and repeated back to me what I told her.  We reviewed the characteristics, features and inheritance patterns of hereditary cancer syndromes. We also discussed genetic testing, including the appropriate family members to test, the process of testing, insurance coverage and turn-around-time for results. We discussed the implications of a negative, positive and/or variant of uncertain significant result. We recommended Rachel Harrell pursue genetic testing for the Breast/Ovarian cancer gene panel. The Breast/Ovarian gene panel offered by [facility] includes sequencing and rearrangement analysis for the following 20 genes:  ATM, BARD1, BRCA1, BRCA2, BRIP1, CDH1, CHEK2, EPCAM, FANCC, MLH1, MSH2, MSH6, NBN, PALB2, PMS2, PTEN, RAD51C, RAD51D, TP53, and XRCC2.     We discussed that if her out of pocket cost for testing is over $100, the laboratory will call and confirm whether she wants to proceed with testing.  If the out of pocket cost of testing is less than $100 she will be billed by the genetic testing laboratory.    PLAN: After considering the risks, benefits, and limitations, Rachel Harrell  provided informed consent to pursue genetic testing and the blood sample was sent to [facility] for analysis of the Breast/Ovarian cancer panel. Results should be available within approximately 2-3 weeks' time, at which point they will be disclosed by telephone to Rachel Harrell, as will any additional recommendations warranted by these results. Rachel Harrell will receive a summary of her genetic counseling visit and a copy of her results once available. This information will also be available in Epic. We encouraged Rachel Harrell to remain in contact with cancer genetics annually so that we can continuously update the family history and inform her of any changes in cancer genetics and testing that may be of benefit for her family. Rachel Harrell questions were answered to her satisfaction today. Our contact information was provided should additional questions or concerns arise.  Lastly, we encouraged Rachel Harrell to remain in contact with cancer genetics annually so that we can continuously update the family history and inform her of any changes in  cancer genetics and testing that may be of benefit for this family.   Ms.  Harrell questions were answered to her satisfaction today. Our contact information was provided should additional questions or concerns arise. Thank you for the referral and allowing [place] to share in the care of your patient.   Ellington Greenslade P. Florene Glen, [place], [facility] Certified Genetic Counselor Santiago [email or web address] phone: [phone]  The patient was seen for a total of 30 minutes in face-to-face genetic counseling.  This patient was discussed with Drs. Magrinat, Lindi Adie and/or Burr Medico who agrees with the above.    _______________________________________________________________________ For Office Staff:  Number of people involved in session: 1 Was an Intern/ student involved with case: no

## 2015-12-29 ENCOUNTER — Telehealth: Payer: Self-pay | Admitting: Genetic Counselor

## 2015-12-29 ENCOUNTER — Encounter: Payer: Self-pay | Admitting: Genetic Counselor

## 2015-12-29 DIAGNOSIS — Z1379 Encounter for other screening for genetic and chromosomal anomalies: Secondary | ICD-10-CM | POA: Insufficient documentation

## 2015-12-29 NOTE — Telephone Encounter (Signed)
LM on VM with good news on results.  Asked that she CB. 

## 2015-12-29 NOTE — Telephone Encounter (Signed)
Negative genetic testing on the Breast/Ovarian cancer panel.  We would recommend that her duaghters start screening 10 years younger than the earliest age of onset.  Therefore her daughters should consider mammograms starting by their mid 35s.  Their providers can do a risk assessment similar to a Tyrer Cus[place] to determine whether they need breast MRI screening.

## 2015-12-30 ENCOUNTER — Ambulatory Visit: Payer: Self-pay | Admitting: Genetic Counselor

## 2015-12-30 DIAGNOSIS — Z1379 Encounter for other screening for genetic and chromosomal anomalies: Secondary | ICD-10-CM

## 2015-12-30 DIAGNOSIS — C50912 Malignant neoplasm of unspecified site of left female breast: Secondary | ICD-10-CM

## 2015-12-30 NOTE — Progress Notes (Signed)
HPI: Ms. Dicamillo was previously seen in the [facility] clinic due to a personal history of cancer and concerns regarding a hereditary predisposition to cancer. Please refer to our prior cancer genetics clinic note for more information regarding Ms. Porreca's medical, social and family histories, and our assessment and recommendations, at the time. Ms. Kemler recent genetic test results were disclosed to her, as were recommendations warranted by these results. These results and recommendations are discussed in more detail below.  FAMILY HISTORY:  We obtained a detailed, 4-generation family history.  Significant diagnoses are listed below: Family History  Problem Relation Age of Onset  . Anesthesia problems Neg Hx   . Alzheimer's disease Father   . Alzheimer's disease Maternal Grandmother     [name] has daughters, ages 69 and 63, for whom she is most interested in genetic testing. She has one full sister, age 57, who is cancer-free. Her mother is alive and cancer-free at 36; her father passed away due to Alzheimer's at the age of 70. Ms. Hanif has one maternal aunt, age 10 who is alive and cancer-free. This aunt has three sons in their late 5s-early 79s who are cancer-free. Her maternal grandmother died of Alzheimer's at 77, and her maternal grandfather passed away young while in [place]. Ms. Oliger father had two full sisters and three full brothers. One sister died in her late 22s, the cause of which [name] was unaware. This aunt had two daughters, but [name] has no further information on these or any of her additional maternal first cousins. The other maternal aunt is alive and cancer-free at 30. All three maternal uncles are still living--one is 5 and the other two are currently in their early 11s. Ms. Cadenas maternal grandmother died at 30 and her grandfather died at 16.Patient's maternal ancestors are of [place] and [place] descent, and paternal ancestors  are of [place] descent. There is no reported Ashkenazi Jewish ancestry. There is no known consanguinity.  GENETIC TEST RESULTS: At the time of Ms. Hoos's visit, we recommended she pursue genetic testing of the Breast/Ovarian cancer gene panel. The Breast/Ovarian gene panel offered by [facility] includes sequencing and rearrangement analysis for the following 20 genes:  ATM, BARD1, BRCA1, BRCA2, BRIP1, CDH1, CHEK2, EPCAM, FANCC, MLH1, MSH2, MSH6, NBN, PALB2, PMS2, PTEN, RAD51C, RAD51D, TP53, and XRCC2.   The report date is December 27, 2015.  Genetic testing was normal, and did not reveal a deleterious mutation in these genes. The test report has been scanned into EPIC and is located under the Molecular Pathology section of the Results Review tab.   We discussed with [name] that since the current genetic testing is not perfect, it is possible there may be a gene mutation in one of these genes that current testing cannot detect, but that chance is small. We also discussed, that it is possible that another gene that has not yet been discovered, or that we have not yet tested, is responsible for the cancer diagnoses in the family, and it is, therefore, important to remain in touch with cancer genetics in the future so that we can continue to offer [name] the most up to date genetic testing.   CANCER SCREENING RECOMMENDATIONS: This result is reassuring and indicates that [name] likely does not have an increased risk for a future cancer due to a mutation in one of these genes. This normal test also suggests that Ms. Nong's cancer was most likely not due to an  inherited predisposition associated with one of these genes.  Most cancers happen by chance and this negative test suggests that her cancer falls into this category.  We, therefore, recommended she continue to follow the cancer management and screening guidelines provided by her oncology and primary healthcare provider.   RECOMMENDATIONS FOR FAMILY  MEMBERS: Women in this family might be at some increased risk of developing cancer, over the general population risk, simply due to the family history of cancer. We recommended women in this family have a yearly mammogram beginning at age 23, or 27 years younger than the earliest onset of cancer, an an annual clinical breast exam, and perform monthly breast self-exams. Women in this family should also have a gynecological exam as recommended by their primary provider. All family members should have a colonoscopy by age 56.  FOLLOW-UP: Lastly, we discussed with [name] that cancer genetics is a rapidly advancing field and it is possible that new genetic tests will be appropriate for her and/or her family members in the future. We encouraged her to remain in contact with cancer genetics on an annual basis so we can update her personal and family histories and let her know of advances in cancer genetics that may benefit this family.   Our contact number was provided. Ms. Asencio questions were answered to her satisfaction, and she knows she is welcome to call us at anytime with additional questions or concerns.   Roma Kayser, MS, [facility] Certified Genetic Counselor [email or web address]

## 2016-05-03 ENCOUNTER — Other Ambulatory Visit (HOSPITAL_COMMUNITY): Payer: BC Managed Care – PPO

## 2016-05-03 ENCOUNTER — Encounter (HOSPITAL_COMMUNITY): Payer: BC Managed Care – PPO

## 2016-05-03 ENCOUNTER — Encounter (HOSPITAL_COMMUNITY): Payer: BC Managed Care – PPO | Attending: Oncology | Admitting: Oncology

## 2016-05-03 ENCOUNTER — Ambulatory Visit (HOSPITAL_COMMUNITY): Payer: BC Managed Care – PPO | Admitting: Oncology

## 2016-05-03 ENCOUNTER — Encounter (HOSPITAL_COMMUNITY): Payer: Self-pay | Admitting: Oncology

## 2016-05-03 VITALS — BP 139/82 | HR 92 | Temp 98.5°F | Resp 16 | Ht 69.0 in | Wt 167.9 lb

## 2016-05-03 DIAGNOSIS — Z9889 Other specified postprocedural states: Secondary | ICD-10-CM | POA: Diagnosis not present

## 2016-05-03 DIAGNOSIS — Z08 Encounter for follow-up examination after completed treatment for malignant neoplasm: Secondary | ICD-10-CM | POA: Insufficient documentation

## 2016-05-03 DIAGNOSIS — Z170 Estrogen receptor positive status [ER+]: Secondary | ICD-10-CM | POA: Diagnosis not present

## 2016-05-03 DIAGNOSIS — C50912 Malignant neoplasm of unspecified site of left female breast: Secondary | ICD-10-CM | POA: Diagnosis not present

## 2016-05-03 DIAGNOSIS — C50812 Malignant neoplasm of overlapping sites of left female breast: Secondary | ICD-10-CM

## 2016-05-03 DIAGNOSIS — Z923 Personal history of irradiation: Secondary | ICD-10-CM | POA: Diagnosis not present

## 2016-05-03 DIAGNOSIS — Z9221 Personal history of antineoplastic chemotherapy: Secondary | ICD-10-CM | POA: Insufficient documentation

## 2016-05-03 LAB — COMPREHENSIVE METABOLIC PANEL
ALBUMIN: 4.1 g/dL (ref 3.5–5.0)
ALT: 18 U/L (ref 14–54)
ANION GAP: 4 — AB (ref 5–15)
AST: 22 U/L (ref 15–41)
Alkaline Phosphatase: 45 U/L (ref 38–126)
BUN: 13 mg/dL (ref 6–20)
CALCIUM: 8.5 mg/dL — AB (ref 8.9–10.3)
CHLORIDE: 108 mmol/L (ref 101–111)
CO2: 26 mmol/L (ref 22–32)
Creatinine, Ser: 0.89 mg/dL (ref 0.44–1.00)
GFR calc non Af Amer: >60 mL/min (ref >60)
GLUCOSE: 101 mg/dL — AB (ref 65–99)
POTASSIUM: 3.8 mmol/L (ref 3.5–5.1)
SODIUM: 138 mmol/L (ref 135–145)
Total Bilirubin: 0.5 mg/dL (ref 0.3–1.2)
Total Protein: 7.1 g/dL (ref 6.5–8.1)

## 2016-05-03 LAB — CBC WITH DIFFERENTIAL/PLATELET
BASOS PCT: 1 %
Basophils Absolute: 0.1 10*3/uL (ref 0.0–0.1)
Eosinophils Absolute: 0.2 10*3/uL (ref 0.0–0.7)
Eosinophils Relative: 3 %
HEMATOCRIT: 37.2 % (ref 36.0–46.0)
HEMOGLOBIN: 12.6 g/dL (ref 12.0–15.0)
Lymphocytes Relative: 24 %
Lymphs Abs: 1.7 10*3/uL (ref 0.7–4.0)
MCH: 29.6 pg (ref 26.0–34.0)
MCHC: 33.9 g/dL (ref 30.0–36.0)
MCV: 87.5 fL (ref 78.0–100.0)
MONOS PCT: 7 %
Monocytes Absolute: 0.5 10*3/uL (ref 0.1–1.0)
NEUTROS ABS: 4.4 10*3/uL (ref 1.7–7.7)
NEUTROS PCT: 65 %
PLATELETS: 195 10*3/uL (ref 150–400)
RBC: 4.25 MIL/uL (ref 3.87–5.11)
RDW: 12.3 % (ref 11.5–15.5)
WBC: 6.8 10*3/uL (ref 4.0–10.5)

## 2016-05-03 NOTE — Patient Instructions (Addendum)
[place] at [facility] Discharge Instructions  RECOMMENDATIONS MADE BY THE CONSULTANT AND ANY TEST RESULTS WILL BE SENT TO YOUR REFERRING PHYSICIAN.  You saw Kirby Crigler, PA-C today. Mammogram Nov 2017 Return to Clinic in 6 months with labs.  Thank you for choosing [place] at [facility] to provide your oncology and hematology care.  To afford each patient quality time with our provider, please arrive at least 15 minutes before your scheduled appointment time.   Beginning January 23rd 2017 lab work for the [facility] will be done in the  Main lab at [facility] on 1st floor. If you have a lab appointment with the [facility] please come in thru the  Main Entrance and check in at the main information desk  You need to re-schedule your appointment should you arrive 10 or more minutes late.  We strive to give you quality time with our providers, and arriving late affects you and other patients whose appointments are after yours.  Also, if you no show three or more times for appointments you may be dismissed from the clinic at the providers discretion.     Again, thank you for choosing [facility].  Our hope is that these requests will decrease the amount of time that you wait before being seen by our physicians.       _____________________________________________________________  Should you have questions after your visit to [facility], please contact our office at [phone] between the hours of 8:30 a.m. and 4:30 p.m.  Voicemails left after 4:30 p.m. will not be returned until the following business day.  For prescription refill requests, have your pharmacy contact our office.         Resources For Cancer Patients and their Caregivers ? American Cancer Society: Can assist with transportation, wigs, general needs, runs Look Good Feel Better.        605-360-1934 ? Cancer Care: Provides financial  assistance, online support groups, medication/co-pay assistance.  1-800-813-HOPE 9342904536) ? [place] Assists [place] cancer patients and their families through emotional , educational and financial support.  519-732-9871 ? [place] DSS Where to apply for food stamps, Medicaid and utility assistance. 616-054-9479 ? RCATS: Transportation to medical appointments. 623-409-3627 ? Social Security Administration: May apply for disability if have a Stage IV cancer. 725-453-7567 [phone] [phone] ? [employer], Disability and Transit Services: Assists with nutrition, care and transit needs. [place] Support Programs: @10RELATIVEDAYS @ > Cancer Support Group  2nd Tuesday of the month 1pm-2pm, Journey Room  > Creative Journey  3rd Tuesday of the month 1130am-1pm, Journey Room  > Look Good Feel Better  1st Wednesday of the month 10am-12 noon, Journey Room (Call [place] to register [phone])

## 2016-05-03 NOTE — Progress Notes (Signed)
Rachel Chroman, MD [place] [address]  Invasive ductal carcinoma of breast, left ([place]) - Plan: CBC with Differential, Comprehensive metabolic panel, Cancer antigen 27.29, Vitamin D 25 hydroxy, MM Digital Screening Unilat R, CANCELED: MS DIGITAL SCREENING TOMO UNI RIGHT  CURRENT THERAPY: Tamoxifen daily, expected to complete 10 years of anti-estrogen therapy in June 2018.  INTERVAL HISTORY: Rachel Harrell 55 y.o. female returns for followup of Stage IIIA vs. Stage IIIB (T4b,N2a,M0) ER+ 100%, PR+ HER2 Neu negative, grade 1 breast cancer with multifocal disease, S/P definitive surgery with mastectomy on 09/10/2006 followed by FEC x 6 cycles and radiation finishing in June 2008.  Started Tamoxifen on 03/20/2007 until she was deemed post-menopausal by [facility] and [place] levels resulting in a switch to Arimidex.  She developed intolerance to Arimidex resulting in a change back to Tamoxifen.  She is S/P hysterectomy/partial oophorectomy 09/10/2006. Patient desires to stay on tamoxifen for 10 years.    Invasive ductal carcinoma of left breast   08/06/2006 Procedure    Left breast needle biopsy     08/06/2006 Pathology Results    BREAST, LEFT, NEEDLE BIOPSY AT 3 O'CLOCK POSITION: - INVASIVE DUCTAL CARCINOMA. - FOCAL DUCTAL CARCINOMA IN SITU, CRIBRIFORM TYPE, INTERMEDIATE TO HIGH GRADE WITH FOCAL NECROSIS.     08/20/2006 Pathology Results    LEFT BREAST, SUBDERMAL MASS, NEEDLE CORE BIOPSIES: INVASIVE MAMMARY CARCINOMA.     09/10/2006 Pathology Results    BREAST, LEFT, SIMPLE MASTECTOMY: - MULTICENTRIC INVASIVE DUCTAL CARCINOMA, LARGEST AREA 1.5 CM, MSBR II OF III. - DUCTAL CARCINOMA IN SITU (DCIS), HIGH GRADE WITH NECROSIS AND CALCIFICATIONS. - EXTENSIVE INTRADUCTAL COMPONENT (JCRT) NEGATIVE     10/07/2006 - 01/13/2007 Chemotherapy    FEC x 6 cycles in dose dense fashion (approximate treatment dates)     01/27/2007 - 03/10/2007 Radiation Therapy         03/20/2007 - 04/19/2009  Anti-estrogen oral therapy    Tamoxifen     04/20/2009 - 04/30/2012 Anti-estrogen oral therapy    Arimidex     04/30/2012 -  Anti-estrogen oral therapy    Tamoxifen     12/30/2015 Genetic Testing    Genetic testing was normal, and did not reveal a deleterious mutation in these genes. The test report has been scanned into EPIC and is located under the Molecular Pathology section of the Results Review tab.        She denies any complaints.    Review of Systems  Constitutional: Negative.   HENT: Negative.   Eyes: Negative.   Respiratory: Negative.   Cardiovascular: Negative.   Gastrointestinal: Negative.   Genitourinary: Negative.   Musculoskeletal: Negative.   Skin: Negative.   Neurological: Negative.   Endo/Heme/Allergies: Negative.   Psychiatric/Behavioral: Negative.     Past Medical History:  Diagnosis Date  . Breast cancer ([place]) 2007   IDC+DCIS of L breast; ER/PR+, Her2-  . 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 HYSTEROTOMY     1 ovary remains  . LAPAROSCOPY     endometrosis  . lt. mastectomy    . PORT WINE STAIN REMOVAL W/ LASER    . PORTACATH PLACEMENT      Family History  Problem Relation Age of Onset  . Alzheimer's disease Father   . Alzheimer's disease Maternal Grandmother   . Anesthesia problems Neg Hx     Social History   Social History  .  Marital status: Married    Spouse name: N/A  . Number of children: N/A  . Years of education: N/A   Social History Main Topics  . Smoking status: Never Smoker  . Smokeless tobacco: Never Used  . Alcohol use Yes     Comment: very occ  . Drug use: No  . Sexual activity: Not Asked   Other Topics Concern  . None   Social History Narrative  . None     PHYSICAL EXAMINATION  ECOG PERFORMANCE STATUS: 0 - Asymptomatic  Vitals:   05/03/16 1000  BP: 139/82  Pulse: 92  Resp: 16  Temp: 98.5 F (36.9 C)    GENERAL:alert, no  distress, well nourished, well developed, comfortable, cooperative, smiling and unaccompanied SKIN: skin color, texture, turgor are normal, no rashes or significant lesions HEAD: Normocephalic, No masses, lesions, tenderness or abnormalities EYES: normal, EOMI, Conjunctiva are pink and non-injected EARS: External ears normal OROPHARYNX:lips, buccal mucosa, and tongue normal and mucous membranes are moist  NECK: supple, trachea midline LYMPH:  not examined BREAST:right breast normal without mass, skin or nipple changes or axillary nodes, left postmastectomy site well healed and free of suspicious changes LUNGS: clear to auscultation and percussion HEART: regular rate & rhythm, no murmurs, no gallops, S1 normal and S2 normal ABDOMEN:abdomen soft, non-tender, normal bowel sounds and no masses or organomegaly BACK: Back symmetric, no curvature. EXTREMITIES:less then 2 second capillary refill, no joint deformities, effusion, or inflammation, no skin discoloration, no cyanosis  NEURO: alert & oriented x 3 with fluent speech, no focal motor/sensory deficits, gait normal   LABORATORY DATA: CBC    Component Value Date/Time   WBC 6.8 05/03/2016 0916   RBC 4.25 05/03/2016 0916   HGB 12.6 05/03/2016 0916   HCT 37.2 05/03/2016 0916   PLT 195 05/03/2016 0916   MCV 87.5 05/03/2016 0916   MCH 29.6 05/03/2016 0916   MCHC 33.9 05/03/2016 0916   RDW 12.3 05/03/2016 0916   LYMPHSABS 1.7 05/03/2016 0916   MONOABS 0.5 05/03/2016 0916   EOSABS 0.2 05/03/2016 0916   BASOSABS 0.1 05/03/2016 0916      Chemistry      Component Value Date/Time   NA 138 05/03/2016 0916   K 3.8 05/03/2016 0916   CL 108 05/03/2016 0916   CO2 26 05/03/2016 0916   BUN 13 05/03/2016 0916   CREATININE 0.89 05/03/2016 0916      Component Value Date/Time   CALCIUM 8.5 (L) 05/03/2016 0916   ALKPHOS 45 05/03/2016 0916   AST 22 05/03/2016 0916   ALT 18 05/03/2016 0916   BILITOT 0.5 05/03/2016 0916        PENDING  LABS:   RADIOGRAPHIC STUDIES:  No results found.   PATHOLOGY:    ASSESSMENT AND PLAN:  Invasive ductal carcinoma of left breast Stage IIIA vs. Stage IIIB (T4b,N2a,M0) ER+ 100%, PR+ HER2 Neu negative, grade 1 breast cancer with multifocal disease, S/P definitive surgery with mastectomy on 09/10/2006 followed by FEC x 6 cycles and radiation finishing in June 2008.  Started Tamoxifen on 03/20/2007 until she was deemed post-menopausal by [facility] and [place] levels resulting in a switch to Arimidex.  She developed intolerance to Arimidex resulting in a change back to Tamoxifen.  She is S/P hysterectomy/partial oophorectomy 09/10/2006. Patient desires to stay on tamoxifen for 10 years.  Labs today: CBC diff, CMET, CA 27.29, and Vit D level.  I personally reviewed and went over laboratory results with the patient.  The results are noted  within this dictation.  Labs in 6 months: CBC diff, CMET, CA 27.29, and Vit D level.  I personally reviewed and went over radiographic studies with the patient.  The results are noted within this dictation.  Mammogram is due in November 2017 and order is placed.  Return in 6 months for follow-up.   ORDERS PLACED FOR THIS ENCOUNTER: Orders Placed This Encounter  Procedures  . MM Digital Screening Unilat R  . CBC with Differential  . Comprehensive metabolic panel  . Cancer antigen 27.29  . Vitamin D 25 hydroxy    MEDICATIONS PRESCRIBED THIS ENCOUNTER: No orders of the defined types were placed in this encounter.   THERAPY PLAN:  NCCN guidelines recommends monitoring for the following for those patients on Tamoxifen/Raloxifene Therapy:  A. Asymptomatic   1. Continue risk-reduction agent   2. Continue follow-up  B. Hot-flashes or other risk-reduction, agent related symptoms   1. Symptomatic treatment.    2. If persists, re-evaluate role of risk-reduction agent   3. Continue risk-reduction agent- Continue follow-up  C. Abnormal vaginal bleeding   1. Prompt  evaluation for endometrial cancer if uterus intact    A. If endometrial pathology found, re-initiation of Tamoxifen may be considered after hysterectomy if early-stage disease     1. Continue follow-up    B. If no endometrial pathology (carcinoma or hyperplasia with or without atypia) found, continue Tamoxifen and re-evaluate if symptoms persist or recur.     1. Continue follow-up  D. Anticipated elective surgery   1. Consider discontinuing Tamoxifen or Raloxifene prior to elective surgery   2. Resume Tamoxifen or Raloxifene postoperatively when ambulation is normal  E. Deep vein thrombosis, pulmonary embolism, cerebrovascular accident, or prolonged immobilization   1. Discontinue tamoxifen or raloxifene, treat underlying condition.   All questions were answered. The patient knows to call the clinic with any problems, questions or concerns. We can certainly see the patient much sooner if necessary.  Patient and plan discussed with Dr. Ancil Linsey and she is in agreement with the aforementioned.   This note is electronically signed by: Doy Mince 05/03/2016 10:35 PM

## 2016-05-03 NOTE — Assessment & Plan Note (Addendum)
Stage IIIA vs. Stage IIIB (T4b,N2a,M0) ER+ 100%, PR+ HER2 Neu negative, grade 1 breast cancer with multifocal disease, S/P definitive surgery with mastectomy on 09/10/2006 followed by FEC x 6 cycles and radiation finishing in June 2008.  Started Tamoxifen on 03/20/2007 until she was deemed post-menopausal by [facility] and [place] levels resulting in a switch to Arimidex.  She developed intolerance to Arimidex resulting in a change back to Tamoxifen.  She is S/P hysterectomy/partial oophorectomy 09/10/2006. Patient desires to stay on tamoxifen for 10 years.  Labs today: CBC diff, CMET, CA 27.29, and Vit D level.  I personally reviewed and went over laboratory results with the patient.  The results are noted within this dictation.  Labs in 6 months: CBC diff, CMET, CA 27.29, and Vit D level.  I personally reviewed and went over radiographic studies with the patient.  The results are noted within this dictation.  Mammogram is due in November 2017 and order is placed.  Return in 6 months for follow-up.

## 2016-05-04 LAB — CANCER ANTIGEN 27.29: CA 27.29: 7.3 U/mL (ref 0.0–38.6)

## 2016-05-04 LAB — VITAMIN D 25 HYDROXY (VIT D DEFICIENCY, FRACTURES): Vit D, 25-Hydroxy: 36.6 ng/mL (ref 30.0–100.0)

## 2016-07-28 ENCOUNTER — Other Ambulatory Visit (HOSPITAL_COMMUNITY): Payer: Self-pay | Admitting: Oncology

## 2016-09-05 ENCOUNTER — Ambulatory Visit (HOSPITAL_COMMUNITY): Payer: BC Managed Care – PPO

## 2016-09-10 ENCOUNTER — Inpatient Hospital Stay (HOSPITAL_COMMUNITY): Admission: RE | Admit: 2016-09-10 | Payer: BC Managed Care – PPO | Source: Ambulatory Visit

## 2016-11-01 ENCOUNTER — Encounter (HOSPITAL_COMMUNITY): Payer: Self-pay | Admitting: Adult Health

## 2016-11-01 ENCOUNTER — Encounter (HOSPITAL_COMMUNITY): Payer: BC Managed Care – PPO

## 2016-11-01 ENCOUNTER — Encounter (HOSPITAL_COMMUNITY): Payer: BC Managed Care – PPO | Attending: Hematology & Oncology | Admitting: Adult Health

## 2016-11-01 VITALS — BP 129/81 | HR 97 | Temp 98.7°F | Resp 18 | Ht 69.0 in | Wt 169.2 lb

## 2016-11-01 DIAGNOSIS — Z170 Estrogen receptor positive status [ER+]: Secondary | ICD-10-CM | POA: Diagnosis not present

## 2016-11-01 DIAGNOSIS — C50812 Malignant neoplasm of overlapping sites of left female breast: Secondary | ICD-10-CM | POA: Diagnosis not present

## 2016-11-01 DIAGNOSIS — Z79810 Long term (current) use of selective estrogen receptor modulators (SERMs): Secondary | ICD-10-CM

## 2016-11-01 DIAGNOSIS — C50919 Malignant neoplasm of unspecified site of unspecified female breast: Secondary | ICD-10-CM | POA: Diagnosis not present

## 2016-11-01 DIAGNOSIS — C50912 Malignant neoplasm of unspecified site of left female breast: Secondary | ICD-10-CM

## 2016-11-01 DIAGNOSIS — E876 Hypokalemia: Secondary | ICD-10-CM | POA: Insufficient documentation

## 2016-11-01 DIAGNOSIS — N951 Menopausal and female climacteric states: Secondary | ICD-10-CM

## 2016-11-01 LAB — CBC WITH DIFFERENTIAL/PLATELET
BASOS ABS: 0.1 10*3/uL (ref 0.0–0.1)
BASOS PCT: 1 %
EOS ABS: 0.1 10*3/uL (ref 0.0–0.7)
EOS PCT: 2 %
HCT: 35 % — ABNORMAL LOW (ref 36.0–46.0)
Hemoglobin: 12.1 g/dL (ref 12.0–15.0)
Lymphocytes Relative: 27 %
Lymphs Abs: 1.9 10*3/uL (ref 0.7–4.0)
MCH: 30.1 pg (ref 26.0–34.0)
MCHC: 34.6 g/dL (ref 30.0–36.0)
MCV: 87.1 fL (ref 78.0–100.0)
MONO ABS: 0.6 10*3/uL (ref 0.1–1.0)
MONOS PCT: 8 %
NEUTROS ABS: 4.4 10*3/uL (ref 1.7–7.7)
Neutrophils Relative %: 62 %
PLATELETS: 187 10*3/uL (ref 150–400)
RBC: 4.02 MIL/uL (ref 3.87–5.11)
RDW: 12.1 % (ref 11.5–15.5)
WBC: 7 10*3/uL (ref 4.0–10.5)

## 2016-11-01 LAB — COMPREHENSIVE METABOLIC PANEL
ALBUMIN: 4 g/dL (ref 3.5–5.0)
ALT: 14 U/L (ref 14–54)
AST: 20 U/L (ref 15–41)
Alkaline Phosphatase: 43 U/L (ref 38–126)
Anion gap: 7 (ref 5–15)
BUN: 16 mg/dL (ref 6–20)
CHLORIDE: 108 mmol/L (ref 101–111)
CO2: 26 mmol/L (ref 22–32)
Calcium: 8.9 mg/dL (ref 8.9–10.3)
Creatinine, Ser: 0.89 mg/dL (ref 0.44–1.00)
GFR calc Af Amer: >60 mL/min (ref >60)
GFR calc non Af Amer: >60 mL/min (ref >60)
GLUCOSE: 99 mg/dL (ref 65–99)
POTASSIUM: 3.2 mmol/L — AB (ref 3.5–5.1)
SODIUM: 141 mmol/L (ref 135–145)
Total Bilirubin: 0.5 mg/dL (ref 0.3–1.2)
Total Protein: 7 g/dL (ref 6.5–8.1)

## 2016-11-01 MED ORDER — POTASSIUM CHLORIDE CRYS ER 20 MEQ PO TBCR: 40.0000 meq | EXTENDED_RELEASE_TABLET | Freq: Every day | ORAL | 0 refills | Status: DC

## 2016-11-01 NOTE — Patient Instructions (Addendum)
[place] at [facility] Discharge Instructions  RECOMMENDATIONS MADE BY THE CONSULTANT AND ANY TEST RESULTS WILL BE SENT TO YOUR REFERRING PHYSICIAN.  Exam with Mike Craze, NP. Labs today.  We are going to repeat your labs in 2 weeks because your potassium was low.  We are prescribing you potassium to take once a day until we redraw your labs.  We will call you with your lab results and any further directions. Return to the clinic in 6 months. Mammogram next week.   Please see Amy for appointments as you leave.     Thank you for choosing [place] at [facility] to provide your oncology and hematology care.  To afford each patient quality time with our provider, please arrive at least 15 minutes before your scheduled appointment time.    If you have a lab appointment with the [place] please come in thru the  Main Entrance and check in at the main information desk  You need to re-schedule your appointment should you arrive 10 or more minutes late.  We strive to give you quality time with our providers, and arriving late affects you and other patients whose appointments are after yours.  Also, if you no show three or more times for appointments you may be dismissed from the clinic at the providers discretion.     Again, thank you for choosing [facility].  Our hope is that these requests will decrease the amount of time that you wait before being seen by our physicians.       _____________________________________________________________  Should you have questions after your visit to [facility], please contact our office at [phone] between the hours of 8:30 a.m. and 4:30 p.m.  Voicemails left after 4:30 p.m. will not be returned until the following business day.  For prescription refill requests, have your pharmacy contact our office.       Resources For Cancer Patients and their  Caregivers ? American Cancer Society: Can assist with transportation, wigs, general needs, runs Look Good Feel Better.        681-758-2332 ? Cancer Care: Provides financial assistance, online support groups, medication/co-pay assistance.  1-800-813-HOPE (503)449-1110) ? [place] Assists [place] cancer patients and their families through emotional , educational and financial support.  985-080-6828 ? [place] DSS Where to apply for food stamps, Medicaid and utility assistance. (747)618-8735 ? RCATS: Transportation to medical appointments. (401) 174-5132 ? Social Security Administration: May apply for disability if have a Stage IV cancer. (251) 710-7670 [phone])022-6366 ? [employer], Disability and Transit Services: Assists with nutrition, care and transit needs. [place] Support Programs: @10RELATIVEDAYS @ > Cancer Support Group  2nd Tuesday of the month 1pm-2pm, Journey Room  > Creative Journey  3rd Tuesday of the month 1130am-1pm, Journey Room  > Look Good Feel Better  1st Wednesday of the month 10am-12 noon, Journey Room (Call [place] to register [phone])

## 2016-11-01 NOTE — Progress Notes (Signed)
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  BRIEF ONCOLOGIC HISTORY:    Invasive ductal carcinoma of left breast   08/06/2006 Procedure    Left breast needle biopsy      08/06/2006 Pathology Results    BREAST, LEFT, NEEDLE BIOPSY AT 3 O'CLOCK POSITION: - INVASIVE DUCTAL CARCINOMA. - FOCAL DUCTAL CARCINOMA IN SITU, CRIBRIFORM TYPE, INTERMEDIATE TO HIGH GRADE WITH FOCAL NECROSIS.      08/20/2006 Pathology Results    LEFT BREAST, SUBDERMAL MASS, NEEDLE CORE BIOPSIES: INVASIVE MAMMARY CARCINOMA.      09/10/2006 Pathology Results    BREAST, LEFT, SIMPLE MASTECTOMY: - MULTICENTRIC INVASIVE DUCTAL CARCINOMA, LARGEST AREA 1.5 CM, MSBR II OF III. - DUCTAL CARCINOMA IN SITU (DCIS), HIGH GRADE WITH NECROSIS AND CALCIFICATIONS. - EXTENSIVE INTRADUCTAL COMPONENT (JCRT) NEGATIVE      10/07/2006 - 01/13/2007 Chemotherapy    FEC x 6 cycles in dose dense fashion (approximate treatment dates)      01/27/2007 - 03/10/2007 Radiation Therapy         03/20/2007 - 04/19/2009 Anti-estrogen oral therapy    Tamoxifen      04/20/2009 - 04/30/2012 Anti-estrogen oral therapy    Arimidex      04/30/2012 -  Anti-estrogen oral therapy    Tamoxifen      12/30/2015 Genetic Testing    Genetic testing was normal, and did not reveal a deleterious mutation in these genes. The test report has been scanned into EPIC and is located under the Molecular Pathology section of the Results Review tab.         HISTORY OF PRESENT ILLNESS: Rachel Harrell 56 y.o. female returns for followup of Stage IIIA vs. Stage IIIB (T4b,N2a,M0) ER+ 100%, PR+ HER2 Neu negative, grade 1 breast cancer with multifocal  disease, S/P definitive surgery with mastectomy on 09/10/2006 followed by FEC x 6 cycles and radiation finishing in June 2008.  Started Tamoxifen on 03/20/2007 until she was deemed post-menopausal by [facility] and [place] levels resulting in a switch to Arimidex.  She developed intolerance to Arimidex resulting in a change back to Tamoxifen.  She is S/P hysterectomy/partial oophorectomy 09/10/2006. Patient desires to stay on tamoxifen for 10 years.   INTERVAL HISTORY:  Ms. Freeze reports feeling very well. She continues to tolerate She endorses hot flashes, which are worse at night.  She doesn't sleep well at night as a result; she tells me she only sleeps about 4-5 hours/night.  She has some hot flashes during the day, but they are not too bothersome for her.  Otherwise, she is largely without complaints.    She was not able to get her mammogram in December. She is scheduled to have it done on 10/16/16 at [facility]     REVIEW OF SYSTEMS:  Review of Systems  Constitutional: Negative.   HENT: Negative.   Eyes: Negative.   Respiratory: Negative.   Cardiovascular: Negative.   Gastrointestinal: Negative.   Genitourinary: Negative.   Musculoskeletal: Negative.   Skin: Negative.   Neurological: Negative.   Endo/Heme/Allergies:       Hot flashes, worse at night   Psychiatric/Behavioral: Negative.     Past Medical History:  Diagnosis Date  .  Breast cancer ([place]) 2007   IDC+DCIS of L breast; ER/PR+, Her2-  . 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 HYSTEROTOMY     1 ovary remains  . LAPAROSCOPY     endometrosis  . lt. mastectomy    . PORT WINE STAIN REMOVAL W/ LASER    . PORTACATH PLACEMENT      Family History  Problem Relation Age of Onset  . Alzheimer's disease Father   . Alzheimer's disease Maternal Grandmother   . Anesthesia problems Neg Hx     Social History   Social History  . Marital status: Married    Spouse  name: N/A  . Number of children: N/A  . Years of education: N/A   Social History Main Topics  . Smoking status: Never Smoker  . Smokeless tobacco: Never Used  . Alcohol use Yes     Comment: very occ  . Drug use: No  . Sexual activity: Not Asked   Other Topics Concern  . None   Social History Narrative  . None     PHYSICAL EXAMINATION  ECOG PERFORMANCE STATUS: 0 - Asymptomatic  Vitals:   11/01/16 1341  BP: 129/81  Pulse: 97  Resp: 18  Temp: 98.7 F (37.1 C)    General: Well-nourished, well-appearing female in no acute distress.  Unaccompanied today.   HEENT: Head is normocephalic.  Pupils equal and reactive to light. Conjunctivae clear without exudate.  Sclerae anicteric. Oral mucosa is pink, moist.  Oropharynx is pink without lesions or erythema.  Lymph: No cervical, supraclavicular, or infraclavicular lymphadenopathy noted on palpation.  Cardiovascular: Regular rate and rhythm.Marland Kitchen Respiratory: Clear to auscultation bilaterally. Chest expansion symmetric; breathing non-labored.  Breast Exam:  -Left breast: s/p mastectomy.  No skin changes; mild hyperpigmentation as expected s/p radiation therapy; no suspicious lesions; healed scar without erythema or nodularity.  -Right breast: No appreciable masses on palpation. No skin redness, thickening, or peau d'orange appearance; no nipple retraction or nipple discharge. -Axilla: No axillary adenopathy bilaterally.  GI: Abdomen soft and round; non-tender, non-distended. Bowel sounds normoactive. No hepatosplenomegaly.   GU: Deferred.  Neuro: No focal deficits. Steady gait.  Psych: Mood and affect normal and appropriate for situation.  Extremities: No edema. Skin: Warm and dry.   LABORATORY DATA: CBC    Component Value Date/Time   WBC 7.0 11/01/2016 1319   RBC 4.02 11/01/2016 1319   HGB 12.1 11/01/2016 1319   HCT 35.0 (L) 11/01/2016 1319   PLT 187 11/01/2016 1319   MCV 87.1 11/01/2016 1319   MCH 30.1 11/01/2016 1319    MCHC 34.6 11/01/2016 1319   RDW 12.1 11/01/2016 1319   LYMPHSABS 1.9 11/01/2016 1319   MONOABS 0.6 11/01/2016 1319   EOSABS 0.1 11/01/2016 1319   BASOSABS 0.1 11/01/2016 1319      Chemistry      Component Value Date/Time   NA 141 11/01/2016 1319   K 3.2 (L) 11/01/2016 1319   CL 108 11/01/2016 1319   CO2 26 11/01/2016 1319   BUN 16 11/01/2016 1319   CREATININE 0.89 11/01/2016 1319      Component Value Date/Time   CALCIUM 8.9 11/01/2016 1319   ALKPHOS 43 11/01/2016 1319   AST 20 11/01/2016 1319   ALT 14 11/01/2016 1319   BILITOT 0.5 11/01/2016 1319        PENDING LABS: None.   RADIOGRAPHIC STUDIES: Most recent mammogram: Right breast only (08/29/15)  ASSESSMENT AND PLAN:  Ms. Butterbaugh is a pleasant 56 y.o. 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  She then started anti-estrogen therapy with Tamoxifen in 03/2007-04/2009, was switched to Arimidex in 04/2009-04/2012 at which time she was switched back to Tamoxifen in 04/2012.  She reports to cancer center for continued follow-up and surveillance of her h/o breast cancer.   Stage IIIB left breast cancer:  -No evidence of disease or recurrence of breast cancer.  She is past due for her annual mammogram, but tells me it is scheduled on 11/05/16.  She will complete Tamoxifen therapy in 03/2017; instructed her to stop taking the medication in June.  We will see her back at the cancer center in 6 months, shortly after she completes her 10 years of anti-estrogen therapy.  If all is well at that visit, then we will go to annual follow-up and continued surveillance.    Hot flashes secondary to Tamoxifen and menopause:  -She has hot flashes which are likely secondary to both Tamoxifen therapy and menopause; the hot flashes/night sweats are affecting  her ability to sleep well.  We discussed different non-hormonal options to help manage her hot flashes. Vitamin E sometimes provides patients minimal benefit. She has tried Effexor XR in the past, but it was not effective at all.  Gabapentin is also effective in managing night sweats/hot flashes secondary to menopause or tamoxifen therapy.  I recommended Gabapentin 300 mg once daily at bedtime; she would like to try the vitamin E before trying any prescription medication, which is certainly reasonable.    Hypokalemia:  -Her potassium is mildly low today at 3.2, unclear etiology but patient is asymptomatic.   I will start her on K-Dur 40 mEq daily for the next 2 weeks. We will bring her back for BMP lab check in about 2 weeks to ensure her potassium has recovered.     Dispo:  -Return in 2 weeks to recheck serum potassium.  -Return to cancer center in 6 months for continued surveillance.      ORDERS PLACED FOR THIS ENCOUNTER: Orders Placed This Encounter  Procedures  . Basic metabolic panel    MEDICATIONS PRESCRIBED THIS ENCOUNTER: Meds ordered this encounter  Medications  . potassium chloride SA (K-DUR,KLOR-CON) 20 MEQ tablet    Sig: Take 2 tablets (40 mEq total) by mouth daily.    Dispense:  30 tablet    Refill:  0    THERAPY PLAN:  NCCN guidelines recommends monitoring for the following for those patients on Tamoxifen/Raloxifene Therapy:  A. Asymptomatic   1. Continue risk-reduction agent   2. Continue follow-up  B. Hot-flashes or other risk-reduction, agent related symptoms   1. Symptomatic treatment.    2. If persists, re-evaluate role of risk-reduction agent   3. Continue risk-reduction agent- Continue follow-up  C. Abnormal vaginal bleeding   1. Prompt evaluation for endometrial cancer if uterus intact    A. If endometrial pathology found, re-initiation of Tamoxifen may be considered after hysterectomy if early-stage disease     1. Continue follow-up    B. If no  endometrial pathology (carcinoma or hyperplasia with or without atypia) found, continue Tamoxifen and re-evaluate if symptoms persist or recur.     1. Continue follow-up  D. Anticipated elective surgery   1. Consider discontinuing Tamoxifen or Raloxifene prior to elective surgery  2. Resume Tamoxifen or Raloxifene postoperatively when ambulation is normal  E. Deep vein thrombosis, pulmonary embolism, cerebrovascular accident, or prolonged immobilization   1. Discontinue tamoxifen or raloxifene, treat underlying condition.   All questions were answered. The patient knows to call the clinic with any problems, questions or concerns. We can certainly see the patient much sooner if necessary.  Patient and plan discussed with Dr. Ancil Linsey and she is in agreement with the aforementioned.   Mike Craze, NP [place] [phone]

## 2016-11-02 ENCOUNTER — Other Ambulatory Visit (HOSPITAL_COMMUNITY): Payer: Self-pay | Admitting: Oncology

## 2016-11-02 ENCOUNTER — Ambulatory Visit (HOSPITAL_COMMUNITY): Payer: BC Managed Care – PPO | Admitting: Hematology & Oncology

## 2016-11-02 ENCOUNTER — Other Ambulatory Visit (HOSPITAL_COMMUNITY): Payer: BC Managed Care – PPO

## 2016-11-02 DIAGNOSIS — E559 Vitamin D deficiency, unspecified: Secondary | ICD-10-CM

## 2016-11-02 LAB — CANCER ANTIGEN 27.29: CA 27.29: 13 U/mL (ref 0.0–38.6)

## 2016-11-02 LAB — VITAMIN D 25 HYDROXY (VIT D DEFICIENCY, FRACTURES): VIT D 25 HYDROXY: 23.2 ng/mL — AB (ref 30.0–100.0)

## 2016-11-02 MED ORDER — ERGOCALCIFEROL 1.25 MG (50000 UT) PO CAPS: 50000.0000 [IU] | ORAL_CAPSULE | ORAL | 0 refills | Status: DC

## 2016-11-05 ENCOUNTER — Ambulatory Visit (HOSPITAL_COMMUNITY)
Admission: RE | Admit: 2016-11-05 | Discharge: 2016-11-05 | Disposition: A | Discharge status: Home / Self Care | Payer: BC Managed Care – PPO | Source: Ambulatory Visit | Attending: Oncology | Admitting: Oncology

## 2016-11-05 DIAGNOSIS — Z1231 Encounter for screening mammogram for malignant neoplasm of breast: Secondary | ICD-10-CM | POA: Diagnosis not present

## 2016-11-05 DIAGNOSIS — C50912 Malignant neoplasm of unspecified site of left female breast: Secondary | ICD-10-CM

## 2016-11-15 ENCOUNTER — Other Ambulatory Visit (HOSPITAL_COMMUNITY): Payer: BC Managed Care – PPO

## 2016-11-16 ENCOUNTER — Encounter (HOSPITAL_COMMUNITY): Payer: BC Managed Care – PPO | Attending: Oncology

## 2016-11-16 DIAGNOSIS — E876 Hypokalemia: Secondary | ICD-10-CM | POA: Diagnosis not present

## 2016-11-16 DIAGNOSIS — C50919 Malignant neoplasm of unspecified site of unspecified female breast: Secondary | ICD-10-CM | POA: Diagnosis present

## 2016-11-16 LAB — BASIC METABOLIC PANEL
ANION GAP: 9 (ref 5–15)
BUN: 14 mg/dL (ref 6–20)
CHLORIDE: 102 mmol/L (ref 101–111)
CO2: 26 mmol/L (ref 22–32)
Calcium: 8.8 mg/dL — ABNORMAL LOW (ref 8.9–10.3)
Creatinine, Ser: 0.97 mg/dL (ref 0.44–1.00)
GFR calc Af Amer: >60 mL/min (ref >60)
GFR calc non Af Amer: >60 mL/min (ref >60)
GLUCOSE: 107 mg/dL — AB (ref 65–99)
POTASSIUM: 4 mmol/L (ref 3.5–5.1)
Sodium: 137 mmol/L (ref 135–145)

## 2017-05-02 ENCOUNTER — Encounter (HOSPITAL_COMMUNITY): Payer: BC Managed Care – PPO | Attending: Oncology | Admitting: Oncology

## 2017-05-02 ENCOUNTER — Encounter (HOSPITAL_COMMUNITY): Payer: Self-pay

## 2017-05-02 VITALS — BP 133/83 | HR 95 | Temp 98.5°F | Resp 16 | Wt 167.8 lb

## 2017-05-02 DIAGNOSIS — C50919 Malignant neoplasm of unspecified site of unspecified female breast: Secondary | ICD-10-CM

## 2017-05-02 DIAGNOSIS — Z853 Personal history of malignant neoplasm of breast: Secondary | ICD-10-CM | POA: Diagnosis not present

## 2017-05-02 NOTE — Progress Notes (Signed)
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  BRIEF ONCOLOGIC HISTORY:    Invasive ductal carcinoma of left breast   08/06/2006 Procedure    Left breast needle biopsy      08/06/2006 Pathology Results    BREAST, LEFT, NEEDLE BIOPSY AT 3 O'CLOCK POSITION: - INVASIVE DUCTAL CARCINOMA. - FOCAL DUCTAL CARCINOMA IN SITU, CRIBRIFORM TYPE, INTERMEDIATE TO HIGH GRADE WITH FOCAL NECROSIS.      08/20/2006 Pathology Results    LEFT BREAST, SUBDERMAL MASS, NEEDLE CORE BIOPSIES: INVASIVE MAMMARY CARCINOMA.      09/10/2006 Pathology Results    BREAST, LEFT, SIMPLE MASTECTOMY: - MULTICENTRIC INVASIVE DUCTAL CARCINOMA, LARGEST AREA 1.5 CM, MSBR II OF III. - DUCTAL CARCINOMA IN SITU (DCIS), HIGH GRADE WITH NECROSIS AND CALCIFICATIONS. - EXTENSIVE INTRADUCTAL COMPONENT (JCRT) NEGATIVE      10/07/2006 - 01/13/2007 Chemotherapy    FEC x 6 cycles in dose dense fashion (approximate treatment dates)      01/27/2007 - 03/10/2007 Radiation Therapy         03/20/2007 - 04/19/2009 Anti-estrogen oral therapy    Tamoxifen      04/20/2009 - 04/30/2012 Anti-estrogen oral therapy    Arimidex      04/30/2012 -  Anti-estrogen oral therapy    Tamoxifen      12/30/2015 Genetic Testing    Genetic testing was normal, and did not reveal a deleterious mutation in these genes. The test report has been scanned into EPIC and is located under the Molecular Pathology section of the Results Review tab.         HISTORY OF PRESENT ILLNESS: Rachel Harrell 56 y.o. female returns for followup of Stage IIIA vs. Stage IIIB (T4b,N2a,M0) ER+ 100%, PR+ HER2 Neu negative, grade 1 breast cancer with  multifocal disease, S/P definitive surgery with mastectomy on 09/10/2006 followed by FEC x 6 cycles and radiation finishing in June 2008.  Started Tamoxifen on 03/20/2007 until she was deemed post-menopausal by [facility] and [place] levels resulting in a switch to Arimidex.  She developed intolerance to Arimidex resulting in a change back to Tamoxifen.  She is S/P hysterectomy/partial oophorectomy 09/10/2006. Patient desires to stay on tamoxifen for 10 years.   INTERVAL HISTORY:  [name] reports feeling very well. She has completed 10 years of endocrine therapy at the end of May. She states that she has not palpated any breast masses in her right breast or chest wall nodules. She had a right breast mammogram in Jan 2018 which was negative.   REVIEW OF SYSTEMS:  Review of Systems  Constitutional: Negative.   HENT: Negative.   Eyes: Negative.   Respiratory: Negative.   Cardiovascular: Negative.   Gastrointestinal: Negative.   Genitourinary: Negative.   Musculoskeletal: Negative.   Skin: Negative.   Neurological: Negative.   Endo/Heme/Allergies:       Hot flashes, worse at night   Psychiatric/Behavioral: Negative.     Past Medical History:  Diagnosis Date  . Breast cancer ([place]) 2007   IDC+DCIS of L breast; ER/PR+, Her2-  . 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 HYSTEROTOMY     1 ovary  remains  . LAPAROSCOPY     endometrosis  . lt. mastectomy    . PORT WINE STAIN REMOVAL W/ LASER    . PORTACATH PLACEMENT      Family History  Problem Relation Age of Onset  . Alzheimer's disease Father   . Alzheimer's disease Maternal Grandmother   . Anesthesia problems Neg Hx     Social History   Social History  . Marital status: Married    Spouse name: N/A  . Number of children: N/A  . Years of education: N/A   Social History Main Topics  . Smoking status: Never Smoker  . Smokeless tobacco: Never Used  . Alcohol use Yes       Comment: very occ  . Drug use: No  . Sexual activity: Not Asked   Other Topics Concern  . None   Social History Narrative  . None     PHYSICAL EXAMINATION  ECOG PERFORMANCE STATUS: 0 - Asymptomatic  Vitals:   05/02/17 0944  BP: 133/83  Pulse: 95  Resp: 16  Temp: 98.5 F (36.9 C)    General: Well-nourished, well-appearing female in no acute distress.  Unaccompanied today.   HEENT: Head is normocephalic.  Pupils equal and reactive to light. Conjunctivae clear without exudate.  Sclerae anicteric. Oral mucosa is pink, moist.  Oropharynx is pink without lesions or erythema.  Lymph: No cervical, supraclavicular, or infraclavicular lymphadenopathy noted on palpation.  Cardiovascular: Regular rate and rhythm.Marland Kitchen Respiratory: Clear to auscultation bilaterally. Chest expansion symmetric; breathing non-labored.  Breast Exam:  -Left breast: s/p mastectomy.  No skin changes or subcutaneous noduels; mild hyperpigmentation s/p radiation therapy.  -Right breast: No appreciable masses on palpation. No skin redness, thickening, or peau d'orange appearance; no nipple retraction or nipple discharge. -Axilla: No axillary adenopathy bilaterally.  GI: Abdomen soft and round; non-tender, non-distended. Bowel sounds normoactive. No hepatosplenomegaly.   GU: Deferred.  Neuro: No focal deficits. Steady gait.  Psych: Mood and affect normal and appropriate for situation.  Extremities: No edema. Skin: Warm and dry.   LABORATORY DATA: CBC    Component Value Date/Time   WBC 7.0 11/01/2016 1319   RBC 4.02 11/01/2016 1319   HGB 12.1 11/01/2016 1319   HCT 35.0 (L) 11/01/2016 1319   PLT 187 11/01/2016 1319   MCV 87.1 11/01/2016 1319   MCH 30.1 11/01/2016 1319   MCHC 34.6 11/01/2016 1319   RDW 12.1 11/01/2016 1319   LYMPHSABS 1.9 11/01/2016 1319   MONOABS 0.6 11/01/2016 1319   EOSABS 0.1 11/01/2016 1319   BASOSABS 0.1 11/01/2016 1319      Chemistry      Component Value Date/Time   NA 137  11/16/2016 1605   K 4.0 11/16/2016 1605   CL 102 11/16/2016 1605   CO2 26 11/16/2016 1605   BUN 14 11/16/2016 1605   CREATININE 0.97 11/16/2016 1605      Component Value Date/Time   CALCIUM 8.8 (L) 11/16/2016 1605   ALKPHOS 43 11/01/2016 1319   AST 20 11/01/2016 1319   ALT 14 11/01/2016 1319   BILITOT 0.5 11/01/2016 1319        PENDING LABS: None.   RADIOGRAPHIC STUDIES: Most recent mammogram: Right breast only (08/29/15)        ASSESSMENT AND PLAN:  Ms. Skidgel is a pleasant 56 y.o. 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  She then started anti-estrogen therapy with Tamoxifen in 03/2007-04/2009, was switched to Arimidex in 04/2009-04/2012 at which time she was switched back to Tamoxifen in 04/2012.  She reports to cancer center for continued follow-up and surveillance of her h/o breast cancer.   Stage IIIB left breast cancer:  -No evidence of disease or recurrence of breast cancer.  Clinically NED on exam today. -She has completed 10 years of antiestrogen therapy. -Repeat screening mammogram of right breast in 10/2017.  Dispo:  -Return in 1 year for follow up. I have advised her to come see [place] sooner if she has any breast changes.      THERAPY PLAN:  NCCN guidelines recommends monitoring for the following for those patients on Tamoxifen/Raloxifene Therapy:  A. Asymptomatic   1. Continue risk-reduction agent   2. Continue follow-up  B. Hot-flashes or other risk-reduction, agent related symptoms   1. Symptomatic treatment.    2. If persists, re-evaluate role of risk-reduction agent   3. Continue risk-reduction agent- Continue follow-up  C. Abnormal vaginal bleeding   1. Prompt evaluation for endometrial cancer if uterus intact    A. If endometrial pathology found, re-initiation of  Tamoxifen may be considered after hysterectomy if early-stage disease     1. Continue follow-up    B. If no endometrial pathology (carcinoma or hyperplasia with or without atypia) found, continue Tamoxifen and re-evaluate if symptoms persist or recur.     1. Continue follow-up  D. Anticipated elective surgery   1. Consider discontinuing Tamoxifen or Raloxifene prior to elective surgery   2. Resume Tamoxifen or Raloxifene postoperatively when ambulation is normal  E. Deep vein thrombosis, pulmonary embolism, cerebrovascular accident, or prolonged immobilization   1. Discontinue tamoxifen or raloxifene, treat underlying condition.   All questions were answered. The patient knows to call the clinic with any problems, questions or concerns. We can certainly see the patient much sooner if necessary.  Twana First, MD

## 2017-05-02 NOTE — Patient Instructions (Signed)
[facility] at [facility] Discharge Instructions  RECOMMENDATIONS MADE BY THE CONSULTANT AND ANY TEST RESULTS WILL BE SENT TO YOUR REFERRING PHYSICIAN.  You saw Dr. Zhou today.  Thank you for choosing [facility] at [facility] to provide your oncology and hematology care.  To afford each patient quality time with our provider, please arrive at least 15 minutes before your scheduled appointment time.    If you have a lab appointment with the Cancer Center please come in thru the  Main Entrance and check in at the main information desk  You need to re-schedule your appointment should you arrive 10 or more minutes late.  We strive to give you quality time with our providers, and arriving late affects you and other patients whose appointments are after yours.  Also, if you no show three or more times for appointments you may be dismissed from the clinic at the providers discretion.     Again, thank you for choosing [facility].  Our hope is that these requests will decrease the amount of time that you wait before being seen by our physicians.       _____________________________________________________________  Should you have questions after your visit to [facility], please contact our office at [phone] between the hours of 8:30 a.m. and 4:30 p.m.  Voicemails left after 4:30 p.m. will not be returned until the following business day.  For prescription refill requests, have your pharmacy contact our office.       Resources For Cancer Patients and their Caregivers ? American Cancer Society: Can assist with transportation, wigs, general needs, runs Look Good Feel Better.        1-888-227-6333 ? Cancer Care: Provides financial assistance, online support groups, medication/co-pay assistance.  1-800-813-HOPE (4673) ? [facility] Assists [place] cancer patients and their families through  emotional , educational and financial support.  336-427-4357 ? [place] DSS Where to apply for food stamps, Medicaid and utility assistance. 336-342-1394 ? [facility]: Transportation to medical appointments. 336-347-2287 ? Social Security Administration: May apply for disability if have a Stage IV cancer. 336-342-7796 1-800-772-1213 ? [place] Aging, Disability and Transit Services: Assists with nutrition, care and transit needs. 336-349-2343  Cancer Center Support Programs: @[email or web address]@ > Cancer Support Group  2nd Tuesday of the month 1pm-2pm, Journey Room  > Creative Journey  3rd Tuesday of the month 1130am-1pm, Journey Room  > Look Good Feel Better  1st Wednesday of the month 10am-12 noon, Journey Room (Call American Cancer Society to register 1-800-395-5775)    

## 2017-05-03 ENCOUNTER — Ambulatory Visit (HOSPITAL_COMMUNITY): Payer: BC Managed Care – PPO

## 2017-10-17 ENCOUNTER — Encounter (INDEPENDENT_AMBULATORY_CARE_PROVIDER_SITE_OTHER): Payer: Self-pay | Admitting: *Deleted

## 2018-03-01 ENCOUNTER — Encounter (HOSPITAL_COMMUNITY): Payer: Self-pay | Admitting: Hematology & Oncology

## 2018-04-07 ENCOUNTER — Other Ambulatory Visit (HOSPITAL_COMMUNITY): Payer: Self-pay | Admitting: Internal Medicine

## 2018-04-07 DIAGNOSIS — Z1231 Encounter for screening mammogram for malignant neoplasm of breast: Secondary | ICD-10-CM

## 2018-04-21 ENCOUNTER — Ambulatory Visit (HOSPITAL_COMMUNITY)
Admission: RE | Admit: 2018-04-21 | Discharge: 2018-04-21 | Disposition: A | Discharge status: Home / Self Care | Payer: BC Managed Care – PPO | Source: Ambulatory Visit | Attending: Internal Medicine | Admitting: Internal Medicine

## 2018-04-21 ENCOUNTER — Encounter (HOSPITAL_COMMUNITY): Payer: Self-pay

## 2018-04-21 DIAGNOSIS — Z1231 Encounter for screening mammogram for malignant neoplasm of breast: Secondary | ICD-10-CM | POA: Insufficient documentation

## 2018-05-02 ENCOUNTER — Encounter (HOSPITAL_COMMUNITY): Payer: Self-pay | Admitting: Internal Medicine

## 2018-05-02 ENCOUNTER — Inpatient Hospital Stay (HOSPITAL_COMMUNITY): Payer: BC Managed Care – PPO | Attending: Internal Medicine | Admitting: Internal Medicine

## 2018-05-02 ENCOUNTER — Ambulatory Visit (HOSPITAL_COMMUNITY): Payer: BC Managed Care – PPO | Admitting: Internal Medicine

## 2018-05-02 VITALS — BP 130/72 | HR 95 | Temp 98.3°F | Resp 16 | Wt 163.6 lb

## 2018-05-02 DIAGNOSIS — C50912 Malignant neoplasm of unspecified site of left female breast: Secondary | ICD-10-CM

## 2018-05-02 DIAGNOSIS — Z853 Personal history of malignant neoplasm of breast: Secondary | ICD-10-CM | POA: Diagnosis not present

## 2018-05-02 DIAGNOSIS — Z9012 Acquired absence of left breast and nipple: Secondary | ICD-10-CM | POA: Diagnosis not present

## 2018-05-02 DIAGNOSIS — C50919 Malignant neoplasm of unspecified site of unspecified female breast: Secondary | ICD-10-CM

## 2018-05-02 MED ORDER — MISC. DEVICES MISC: 0 refills | Status: AC

## 2018-05-02 NOTE — Progress Notes (Signed)
Diagnosis Infiltrating ductal carcinoma of breast, unspecified laterality (HCC)  Invasive ductal carcinoma of breast, left ([place]) - Plan: MM Digital Screening Unilat R  Staging Cancer Staging Invasive ductal carcinoma of left breast Staging form: Breast, AJCC 7th Edition - Clinical: Stage IIIB (T4b, N2a, cM0) - Signed by Baird Cancer, PA on 03/28/2011   Assessment and Plan:  1.  Stage IIIb left breast cancer ER+/PR+/HER2-, diagnosed in 07/2006; treated with left mastectomy, adjuvant chemo with FEC x 6 cycles (completed in 01/2007), followed by adjuvant radiation therapy.  She then started anti-estrogen therapy with Tamoxifen in 03/2007-04/2009, was switched to Arimidex in 04/2009-04/2012 at which time she was switched back to Tamoxifen in 04/2012.  She has completed 10 years of endocrine therapy in 03/2017.    Pt is here for follow-up.  Right breast screening mammogram done 04/21/2018 reviewed and is negative.  She will RTC in 1 year for follow-up with right breast screening mammogram.  She is requesting RX for mastectomy bras and Rx is given today.    2.  Health maintenance.  Pt has not undergone colonoscopy or dexa.  She was given the option of referral, but pt indicates she will follow-up with PCP to discuss.    Interval History:  Stage IIIB (T4b,N2a,M0) ER+ 100%, PR+ HER2 Neu negative, grade 1 breast cancer with multifocal disease, S/P definitive surgery with mastectomy on 09/10/2006 followed by FEC x 6 cycles and radiation finishing in June 2008.  Started Tamoxifen on 03/20/2007 until she was deemed post-menopausal by [facility] levels resulting in a switch to Arimidex.  She developed intolerance to Arimidex resulting in a change back to Tamoxifen.  She is S/P hysterectomy/partial oophorectomy 09/10/2006. Patient desired to stay on tamoxifen for 10 years. She completed endocrine therapy in 03/2017.    Current Status:  Pt is seen today for follow-up.  She is here to go over mammogram.  She is  requesting mastectomy bras.  She denies any breast complaints.      Invasive ductal carcinoma of left breast   08/06/2006 Procedure    Left breast needle biopsy      08/06/2006 Pathology Results    BREAST, LEFT, NEEDLE BIOPSY AT 3 O'CLOCK POSITION: - INVASIVE DUCTAL CARCINOMA. - FOCAL DUCTAL CARCINOMA IN SITU, CRIBRIFORM TYPE, INTERMEDIATE TO HIGH GRADE WITH FOCAL NECROSIS.      08/20/2006 Pathology Results    LEFT BREAST, SUBDERMAL MASS, NEEDLE CORE BIOPSIES: INVASIVE MAMMARY CARCINOMA.      09/10/2006 Pathology Results    BREAST, LEFT, SIMPLE MASTECTOMY: - MULTICENTRIC INVASIVE DUCTAL CARCINOMA, LARGEST AREA 1.5 CM, MSBR II OF III. - DUCTAL CARCINOMA IN SITU (DCIS), HIGH GRADE WITH NECROSIS AND CALCIFICATIONS. - EXTENSIVE INTRADUCTAL COMPONENT (JCRT) NEGATIVE      10/07/2006 - 01/13/2007 Chemotherapy    FEC x 6 cycles in dose dense fashion (approximate treatment dates)      01/27/2007 - 03/10/2007 Radiation Therapy         03/20/2007 - 04/19/2009 Anti-estrogen oral therapy    Tamoxifen      04/20/2009 - 04/30/2012 Anti-estrogen oral therapy    Arimidex      04/30/2012 - 03/07/2017 Anti-estrogen oral therapy    Tamoxifen      12/30/2015 Genetic Testing    Genetic testing was normal, and did not reveal a deleterious mutation in these genes. The test report has been scanned into EPIC and is located under the Molecular Pathology section of the Results Review tab.  Problem List Patient Active Problem List   Diagnosis Date Noted  . Genetic testing [Z13.79] 12/29/2015  . Invasive ductal carcinoma of left breast [C50.919] 03/28/2011    Past Medical History Past Medical History:  Diagnosis Date  . Breast cancer ([place]) 2007   IDC+DCIS of L breast; ER/PR+, Her2-  . Invasive ductal carcinoma of left breast 03/28/2011    Past Surgical History Past Surgical History:  Procedure Laterality Date  . HYSTEROTOMY     1 ovary remains  . LAPAROSCOPY     endometrosis   . lt. mastectomy    . PORT WINE STAIN REMOVAL W/ LASER    . PORTACATH PLACEMENT      Family History Family History  Problem Relation Age of Onset  . Alzheimer's disease Father   . Alzheimer's disease Maternal Grandmother   . Anesthesia problems Neg Hx      Social History  reports that she has never smoked. She has never used smokeless tobacco. She reports that she drinks alcohol. She reports that she does not use drugs.  Medications  Current Outpatient Medications:  .  Calcium Carb-Cholecalciferol (CALTRATE 600+D) 600-800 MG-UNIT TABS, Take 1 tablet by mouth 2 (two) times daily., Disp: , Rfl:  .  diphenhydramine-acetaminophen (TYLENOL PM EXTRA STRENGTH) 25-500 MG TABS, Take 1 tablet by mouth at bedtime as needed., Disp: , Rfl:  .  Multiple Vitamin (MULTIVITAMIN) tablet, Take 1 tablet by mouth daily.  , Disp: , Rfl:   Allergies Codeine and Morphine and related  Review of Systems Review of Systems - Oncology ROS negative    Physical Exam  Vitals Wt Readings from Last 3 Encounters:  05/02/18 163 lb 9.6 oz (74.2 kg)  05/02/17 167 lb 12.8 oz (76.1 kg)  11/01/16 169 lb 3.2 oz (76.7 kg)   Temp Readings from Last 3 Encounters:  05/02/18 98.3 F (36.8 C) (Oral)  05/02/17 98.5 F (36.9 C) (Oral)  11/01/16 98.7 F (37.1 C) (Oral)   BP Readings from Last 3 Encounters:  05/02/18 130/72  05/02/17 133/83  11/01/16 129/81   Pulse Readings from Last 3 Encounters:  05/02/18 95  05/02/17 95  11/01/16 97   Constitutional: Well-developed, well-nourished, and in no distress.   HENT: Head: Normocephalic and atraumatic.  Mouth/Throat: No oropharyngeal exudate. Mucosa moist. Eyes: Pupils are equal, round, and reactive to light. Conjunctivae are normal. No scleral icterus.  Neck: Normal range of motion. Neck supple. No JVD present.  Cardiovascular: Normal rate, regular rhythm and normal heart sounds.  Exam reveals no gallop and no friction rub.   No murmur  heard. Pulmonary/Chest: Effort normal and breath sounds normal. No respiratory distress. No wheezes.No rales.  Abdominal: Soft. Bowel sounds are normal. No distension. There is no tenderness. There is no guarding.  Musculoskeletal: No edema or tenderness.  Lymphadenopathy: No cervical, axillary or supraclavicular adenopathy.  Neurological: Alert and oriented to person, place, and time. No cranial nerve deficit.  Skin: Skin is warm and dry. No rash noted. No erythema. No pallor.  Psychiatric: Affect and judgment normal.  Breast exam:  Chaperone present. Left mastectomy site shows no palpable nodules or signs of chest wall recurrence.  Right breast shows no dominant masses.    Labs No visits with results within 3 Day(s) from this visit.  Latest known visit with results is:  Appointment on 11/16/2016  Component Date Value Ref Range Status  . Sodium 11/16/2016 137  135 - 145 mmol/L Final  . Potassium 11/16/2016 4.0  3.5 - 5.1  mmol/L Final  . Chloride 11/16/2016 102  101 - 111 mmol/L Final  . CO2 11/16/2016 26  22 - 32 mmol/L Final  . Glucose, Bld 11/16/2016 107* 65 - 99 mg/dL Final  . BUN 11/16/2016 14  6 - 20 mg/dL Final  . Creatinine, Ser 11/16/2016 0.97  0.44 - 1.00 mg/dL Final  . Calcium 11/16/2016 8.8* 8.9 - 10.3 mg/dL Final  . GFR calc non Af Amer 11/16/2016 >60  >60 mL/min Final  . GFR calc Af Amer 11/16/2016 >60  >60 mL/min Final   Comment: (NOTE) The eGFR has been calculated using the CKD EPI equation. This calculation has not been validated in all clinical situations. eGFR's persistently <60 mL/min signify possible Chronic Kidney Disease.   . Anion gap 11/16/2016 9  5 - 15 Final     Pathology Orders Placed This Encounter  Procedures  . MM Digital Screening Unilat R    Standing Status:   Future    Standing Expiration Date:   05/02/2019    Order Specific Question:   Reason for Exam (SYMPTOM  OR DIAGNOSIS REQUIRED)    Answer:   left breast cancer    Order Specific  Question:   Is the patient pregnant?    Answer:   No    Order Specific Question:   Preferred imaging location?    Answer:   [facility]       Zoila Shutter MD

## 2018-10-22 ENCOUNTER — Encounter (INDEPENDENT_AMBULATORY_CARE_PROVIDER_SITE_OTHER): Payer: Self-pay | Admitting: *Deleted

## 2019-05-05 ENCOUNTER — Ambulatory Visit (HOSPITAL_COMMUNITY): Payer: BC Managed Care – PPO | Admitting: Hematology

## 2019-05-08 ENCOUNTER — Ambulatory Visit (HOSPITAL_COMMUNITY): Payer: BC Managed Care – PPO | Admitting: Hematology

## 2019-05-15 ENCOUNTER — Other Ambulatory Visit (HOSPITAL_COMMUNITY): Payer: BC Managed Care – PPO

## 2019-05-22 ENCOUNTER — Ambulatory Visit (HOSPITAL_COMMUNITY): Payer: BC Managed Care – PPO | Admitting: Nurse Practitioner

## 2019-06-12 ENCOUNTER — Other Ambulatory Visit (HOSPITAL_COMMUNITY): Payer: Self-pay | Admitting: Nurse Practitioner

## 2019-06-12 DIAGNOSIS — C50919 Malignant neoplasm of unspecified site of unspecified female breast: Secondary | ICD-10-CM

## 2019-06-16 ENCOUNTER — Inpatient Hospital Stay (HOSPITAL_COMMUNITY): Payer: BC Managed Care – PPO | Attending: Hematology

## 2019-06-16 ENCOUNTER — Other Ambulatory Visit: Payer: Self-pay

## 2019-06-16 DIAGNOSIS — C50919 Malignant neoplasm of unspecified site of unspecified female breast: Secondary | ICD-10-CM

## 2019-06-16 DIAGNOSIS — Z923 Personal history of irradiation: Secondary | ICD-10-CM | POA: Insufficient documentation

## 2019-06-16 DIAGNOSIS — Z79899 Other long term (current) drug therapy: Secondary | ICD-10-CM | POA: Insufficient documentation

## 2019-06-16 DIAGNOSIS — Z170 Estrogen receptor positive status [ER+]: Secondary | ICD-10-CM | POA: Insufficient documentation

## 2019-06-16 DIAGNOSIS — Z9221 Personal history of antineoplastic chemotherapy: Secondary | ICD-10-CM | POA: Insufficient documentation

## 2019-06-16 DIAGNOSIS — E559 Vitamin D deficiency, unspecified: Secondary | ICD-10-CM | POA: Diagnosis not present

## 2019-06-16 DIAGNOSIS — C50912 Malignant neoplasm of unspecified site of left female breast: Secondary | ICD-10-CM | POA: Insufficient documentation

## 2019-06-16 DIAGNOSIS — Z853 Personal history of malignant neoplasm of breast: Secondary | ICD-10-CM | POA: Insufficient documentation

## 2019-06-16 LAB — COMPREHENSIVE METABOLIC PANEL
ALT: 15 U/L (ref 0–44)
AST: 18 U/L (ref 15–41)
Albumin: 4.1 g/dL (ref 3.5–5.0)
Alkaline Phosphatase: 63 U/L (ref 38–126)
Anion gap: 8 (ref 5–15)
BUN: 13 mg/dL (ref 6–20)
CO2: 26 mmol/L (ref 22–32)
Calcium: 8.8 mg/dL — ABNORMAL LOW (ref 8.9–10.3)
Chloride: 106 mmol/L (ref 98–111)
Creatinine, Ser: 0.82 mg/dL (ref 0.44–1.00)
GFR calc Af Amer: >60 mL/min (ref >60)
GFR calc non Af Amer: >60 mL/min (ref >60)
Glucose, Bld: 99 mg/dL (ref 70–99)
Potassium: 3.4 mmol/L — ABNORMAL LOW (ref 3.5–5.1)
Sodium: 140 mmol/L (ref 135–145)
Total Bilirubin: 0.7 mg/dL (ref 0.3–1.2)
Total Protein: 7.1 g/dL (ref 6.5–8.1)

## 2019-06-16 LAB — CBC WITH DIFFERENTIAL/PLATELET
Abs Immature Granulocytes: 0.02 10*3/uL (ref 0.00–0.07)
Basophils Absolute: 0.1 10*3/uL (ref 0.0–0.1)
Basophils Relative: 1 %
Eosinophils Absolute: 0.2 10*3/uL (ref 0.0–0.5)
Eosinophils Relative: 2 %
HCT: 40.2 % (ref 36.0–46.0)
Hemoglobin: 12.9 g/dL (ref 12.0–15.0)
Immature Granulocytes: 0 %
Lymphocytes Relative: 27 %
Lymphs Abs: 1.9 10*3/uL (ref 0.7–4.0)
MCH: 28.3 pg (ref 26.0–34.0)
MCHC: 32.1 g/dL (ref 30.0–36.0)
MCV: 88.2 fL (ref 80.0–100.0)
Monocytes Absolute: 0.6 10*3/uL (ref 0.1–1.0)
Monocytes Relative: 8 %
Neutro Abs: 4.6 10*3/uL (ref 1.7–7.7)
Neutrophils Relative %: 62 %
Platelets: 239 10*3/uL (ref 150–400)
RBC: 4.56 MIL/uL (ref 3.87–5.11)
RDW: 12.2 % (ref 11.5–15.5)
WBC: 7.3 10*3/uL (ref 4.0–10.5)
nRBC: 0 % (ref 0.0–0.2)

## 2019-06-16 LAB — VITAMIN B12: Vitamin B-12: 479 pg/mL (ref 180–914)

## 2019-06-16 LAB — LACTATE DEHYDROGENASE: LDH: 150 U/L (ref 98–192)

## 2019-06-17 LAB — CANCER ANTIGEN 27.29: CA 27.29: 4.4 U/mL (ref 0.0–38.6)

## 2019-06-17 LAB — VITAMIN D 25 HYDROXY (VIT D DEFICIENCY, FRACTURES): Vit D, 25-Hydroxy: 25 ng/mL — ABNORMAL LOW (ref 30.0–100.0)

## 2019-06-23 ENCOUNTER — Inpatient Hospital Stay (HOSPITAL_BASED_OUTPATIENT_CLINIC_OR_DEPARTMENT_OTHER): Payer: BC Managed Care – PPO | Admitting: Nurse Practitioner

## 2019-06-23 ENCOUNTER — Encounter (HOSPITAL_COMMUNITY): Payer: Self-pay | Admitting: Nurse Practitioner

## 2019-06-23 ENCOUNTER — Other Ambulatory Visit: Payer: Self-pay

## 2019-06-23 VITALS — BP 141/94 | HR 109 | Temp 96.9°F | Resp 20 | Wt 158.6 lb

## 2019-06-23 DIAGNOSIS — E559 Vitamin D deficiency, unspecified: Secondary | ICD-10-CM | POA: Diagnosis not present

## 2019-06-23 DIAGNOSIS — Z1231 Encounter for screening mammogram for malignant neoplasm of breast: Secondary | ICD-10-CM | POA: Diagnosis not present

## 2019-06-23 DIAGNOSIS — C50919 Malignant neoplasm of unspecified site of unspecified female breast: Secondary | ICD-10-CM | POA: Diagnosis not present

## 2019-06-23 DIAGNOSIS — C50912 Malignant neoplasm of unspecified site of left female breast: Secondary | ICD-10-CM | POA: Diagnosis not present

## 2019-06-23 MED ORDER — ERGOCALCIFEROL 1.25 MG (50000 UT) PO CAPS: 50000.0000 [IU] | ORAL_CAPSULE | ORAL | 4 refills | Status: AC

## 2019-06-23 NOTE — Patient Instructions (Signed)
[facility] Discharge Instructions  Follow up in 1 year with mammogram and labs    Thank you for choosing [facility] at [facility] to provide your oncology and hematology care.  To afford each patient quality time with our provider, please arrive at least 15 minutes before your scheduled appointment time.   If you have a lab appointment with the Cancer Center please come in thru the Main Entrance and check in at the main information desk.  You need to re-schedule your appointment should you arrive 10 or more minutes late.  We strive to give you quality time with our providers, and arriving late affects you and other patients whose appointments are after yours.  Also, if you no show three or more times for appointments you may be dismissed from the clinic at the providers discretion.     Again, thank you for choosing [facility].  Our hope is that these requests will decrease the amount of time that you wait before being seen by our physicians.       _____________________________________________________________  Should you have questions after your visit to [facility], please contact our office at [phone] between the hours of 8:00 a.m. and 4:30 p.m.  Voicemails left after 4:00 p.m. will not be returned until the following business day.  For prescription refill requests, have your pharmacy contact our office and allow 72 hours.    Due to Covid, you will need to wear a mask upon entering the hospital. If you do not have a mask, a mask will be given to you at the Main Entrance upon arrival. For doctor visits, patients may have 1 support person with them. For treatment visits, patients can not have anyone with them due to social distancing guidelines and our immunocompromised population.      

## 2019-06-23 NOTE — Assessment & Plan Note (Addendum)
1.  Stage IIIb left breast cancer: -Diagnosed in 07/2006, ER+/PR+/HER-,treated with left mastectomy, adjuvant chemo with FEC x 6 cycles (completed in 01/2007), followed by adjuvant radiation therapy. -She then started anti-estrogen therapy with tamoxifen in 03/2007-04/2009., was switched to Arimidex in 04/2009-04/2012 at which time she was switched back to Tamoxifen 04/2012. -She is completed 10 years of endocrine therapy on 03/2017. -She is also status post hysterectomy/partial oophorectomy 09/10/2006. - Last mammogram done on 04/21/2018 was BI-RADS Category 1 negative. -She is due for mammogram at this time.  We will make sure that scheduled. -Physical assessment done today did not reveal any adenopathy or masses. -Labs done on 06/16/2019 showed potassium 3.4, LFTs WNL, LDH 150, WBC 7.3, hemoglobin 12.9, platelets 239 -She will follow-up in 1 year with repeat mammogram and labs.  2.  Vitamin D deficiency: -Labs done on 06/16/2019 showed vitamin D level at 25.0 -I have called in a prescription for 50,000 units weekly. -We will recheck labs at her next visit.  3. Health maintenance: -Patient does not wish to under go DEXA scan or colonoscopy at this time. -She will follow up with her PCP.

## 2019-06-23 NOTE — Progress Notes (Signed)
[address] [place], [place] [address]   CLINIC:  Medical Oncology/Hematology  PCP:  Glenda Chroman, MD [place] [address] [phone]   REASON FOR VISIT: Follow-up for breast cancer  CURRENT THERAPY: Surveillance per NCCN guidelines  BRIEF ONCOLOGIC HISTORY:  Oncology History  Invasive ductal carcinoma of left breast  08/06/2006 Procedure   Left breast needle biopsy   08/06/2006 Pathology Results   BREAST, LEFT, NEEDLE BIOPSY AT 3 O'CLOCK POSITION: - INVASIVE DUCTAL CARCINOMA. - FOCAL DUCTAL CARCINOMA IN SITU, CRIBRIFORM TYPE, INTERMEDIATE TO HIGH GRADE WITH FOCAL NECROSIS.   08/20/2006 Pathology Results   LEFT BREAST, SUBDERMAL MASS, NEEDLE CORE BIOPSIES: INVASIVE MAMMARY CARCINOMA.   09/10/2006 Pathology Results   BREAST, LEFT, SIMPLE MASTECTOMY: - MULTICENTRIC INVASIVE DUCTAL CARCINOMA, LARGEST AREA 1.5 CM, MSBR II OF III. - DUCTAL CARCINOMA IN SITU (DCIS), HIGH GRADE WITH NECROSIS AND CALCIFICATIONS. - EXTENSIVE INTRADUCTAL COMPONENT (JCRT) NEGATIVE   10/07/2006 - 01/13/2007 Chemotherapy   FEC x 6 cycles in dose dense fashion (approximate treatment dates)   01/27/2007 - 03/10/2007 Radiation Therapy     03/20/2007 - 04/19/2009 Anti-estrogen oral therapy   Tamoxifen   04/20/2009 - 04/30/2012 Anti-estrogen oral therapy   Arimidex   04/30/2012 - 03/07/2017 Anti-estrogen oral therapy   Tamoxifen   12/30/2015 Genetic Testing   Genetic testing was normal, and did not reveal a deleterious mutation in these genes. The test report has been scanned into EPIC and is located under the Molecular Pathology section of the Results Review tab.        CANCER STAGING: Cancer Staging Invasive ductal carcinoma of left breast Staging form: Breast, AJCC 7th Edition - Clinical: Stage IIIB (T4b, N2a, cM0) - Signed by Baird Cancer, PA on 03/28/2011    INTERVAL HISTORY:  Ms. Seyller 58 y.o. female returns for routine follow-up for breast  cancer.  Patient reports she has been feeling well and having no complaints at this time. Denies any nausea, vomiting, or diarrhea. Denies any new pains. Had not noticed any recent bleeding such as epistaxis, hematuria or hematochezia. Denies recent chest pain on exertion, shortness of breath on minimal exertion, pre-syncopal episodes, or palpitations. Denies any numbness or tingling in hands or feet. Denies any recent fevers, infections, or recent hospitalizations. Patient reports appetite at 100% and energy level at 100%.  She is eating well maintaining her weight at this time.    REVIEW OF SYSTEMS:  Review of Systems  All other systems reviewed and are negative.    PAST MEDICAL/SURGICAL HISTORY:  Past Medical History:  Diagnosis Date  . Breast cancer ([place]) 2007   IDC+DCIS of L breast; ER/PR+, Her2-  . 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 HYSTEROTOMY     1 ovary remains  . LAPAROSCOPY     endometrosis  . lt. mastectomy    . PORT WINE STAIN REMOVAL W/ LASER    . PORTACATH PLACEMENT       SOCIAL HISTORY:  Social History   Socioeconomic History  . Marital status: Married    Spouse name: Not on file  . Number of children: Not on file  . Years of education: Not on file  . Highest education level: Not on file  Occupational History  . Not on file  Social Needs  . Financial resource strain: Not on file  . Food insecurity    Worry: Not on file  Inability: Not on file  . Transportation needs    Medical: Not on file    Non-medical: Not on file  Tobacco Use  . Smoking status: Never Smoker  . Smokeless tobacco: Never Used  Substance and Sexual Activity  . Alcohol use: Yes    Comment: very occ  . Drug use: No  . Sexual activity: Not on file  Lifestyle  . Physical activity    Days per week: Not on file    Minutes per session: Not on file  . Stress: Not on file  Relationships  . Social Product manager on phone: Not on file    Gets together: Not on file    Attends religious service: Not on file    Active member of club or organization: Not on file    Attends meetings of clubs or organizations: Not on file    Relationship status: Not on file  . Intimate partner violence    Fear of current or ex partner: Not on file    Emotionally abused: Not on file    Physically abused: Not on file    Forced sexual activity: Not on file  Other Topics Concern  . Not on file  Social History Narrative  . Not on file    FAMILY HISTORY:  Family History  Problem Relation Age of Onset  . Alzheimer's disease Father   . Alzheimer's disease Maternal Grandmother   . Anesthesia problems Neg Hx     CURRENT MEDICATIONS:  Outpatient Encounter Medications as of 06/23/2019  Medication Sig  . Calcium Carb-Cholecalciferol (CALTRATE 600+D) 600-800 MG-UNIT TABS Take 1 tablet by mouth 2 (two) times daily.  . Melatonin 10 MG TABS Take 1 tablet by mouth every evening.  . Misc. Devices MISC Please provide patient with (7) seven mastectomy bras and (2) two prosthesis due to mastectomy  . Multiple Vitamin (MULTIVITAMIN) tablet Take 1 tablet by mouth daily.    . ergocalciferol (VITAMIN D2) 1.25 MG (50000 UT) capsule Take 1 capsule (50,000 Units total) by mouth once a week.  . [DISCONTINUED] diphenhydramine-acetaminophen (TYLENOL PM EXTRA STRENGTH) 25-500 MG TABS Take 1 tablet by mouth at bedtime as needed.   No facility-administered encounter medications on file as of 06/23/2019.     ALLERGIES:  Allergies  Allergen Reactions  . Codeine Nausea And Vomiting  . Morphine And Related Nausea And Vomiting     PHYSICAL EXAM:  ECOG Performance status: 1  Vitals:   06/23/19 1459  BP: (!) 141/94  Pulse: (!) 109  Resp: 20  Temp: (!) 96.9 F (36.1 C)  SpO2: 97%   Filed Weights   06/23/19 1459  Weight: 158 lb 9.6 oz (71.9 kg)    Physical Exam Constitutional:      Appearance: Normal appearance. She is  normal weight.  Cardiovascular:     Rate and Rhythm: Normal rate and regular rhythm.     Heart sounds: Normal heart sounds.  Pulmonary:     Effort: Pulmonary effort is normal.     Breath sounds: Normal breath sounds.  Abdominal:     General: Bowel sounds are normal.     Palpations: Abdomen is soft.  Musculoskeletal: Normal range of motion.  Skin:    General: Skin is warm and dry.  Neurological:     Mental Status: She is alert and oriented to person, place, and time. Mental status is at baseline.  Psychiatric:        Mood and Affect: Mood normal.  Behavior: Behavior normal.        Thought Content: Thought content normal.        Judgment: Judgment normal.   Breast: RIGHT: No palpable masses, no skin changes or nipple discharge, no adenopathy. LEFT: Mastectomy site well-healed no adenopathy or masses felt.  LABORATORY DATA:  I have reviewed the labs as listed.  CBC    Component Value Date/Time   WBC 7.3 06/16/2019 1501   RBC 4.56 06/16/2019 1501   HGB 12.9 06/16/2019 1501   HCT 40.2 06/16/2019 1501   PLT 239 06/16/2019 1501   MCV 88.2 06/16/2019 1501   MCH 28.3 06/16/2019 1501   MCHC 32.1 06/16/2019 1501   RDW 12.2 06/16/2019 1501   LYMPHSABS 1.9 06/16/2019 1501   MONOABS 0.6 06/16/2019 1501   EOSABS 0.2 06/16/2019 1501   BASOSABS 0.1 06/16/2019 1501   CMP Latest Ref Rng & Units 06/16/2019 11/16/2016 11/01/2016  Glucose 70 - 99 mg/dL 99 107(H) 99  BUN 6 - 20 mg/dL _0 Creatinine 0.44 - 1.00 mg/dL 0.82 0.97 0.89  Sodium 135 - 145 mmol/L 140 137 141  Potassium 3.5 - 5.1 mmol/L 3.4(L) 4.0 3.2(L)  Chloride 98 - 111 mmol/L 106 102 108  CO2 22 - 32 mmol/L _1 Calcium 8.9 - 10.3 mg/dL 8.8(L) 8.8(L) 8.9  Total Protein 6.5 - 8.1 g/dL 7.1 - 7.0  Total Bilirubin 0.3 - 1.2 mg/dL 0.7 - 0.5  Alkaline Phos 38 - 126 U/L 63 - 43  AST 15 - 41 U/L 18 - 20  ALT 0 - 44 U/L 15 - 14      I personally performed a face-to-face visit.  All questions were answered to  patient's stated satisfaction. Encouraged patient to call with any new concerns or questions before his next visit to the cancer center and we can certain see him sooner, if needed.     ASSESSMENT & PLAN:   Invasive ductal carcinoma of left breast 1.  Stage IIIb left breast cancer: -Diagnosed in 07/2006, ER+/PR+/HER-,treated with left mastectomy, adjuvant chemo with FEC x 6 cycles (completed in 01/2007), followed by adjuvant radiation therapy. -She then started anti-estrogen therapy with tamoxifen in 03/2007-04/2009., was switched to Arimidex in 04/2009-04/2012 at which time she was switched back to Tamoxifen 04/2012. -She is completed 10 years of endocrine therapy on 03/2017. -She is also status post hysterectomy/partial oophorectomy 09/10/2006. - Last mammogram done on 04/21/2018 was BI-RADS Category 1 negative. -She is due for mammogram at this time.  We will make sure that scheduled. -Physical assessment done today did not reveal any adenopathy or masses. -Labs done on 06/16/2019 showed potassium 3.4, LFTs WNL, LDH 150, WBC 7.3, hemoglobin 12.9, platelets 239 -She will follow-up in 1 year with repeat mammogram and labs.  2.  Vitamin D deficiency: -Labs done on 06/16/2019 showed vitamin D level at 25.0 -I have called in a prescription for 50,000 units weekly. -We will recheck labs at her next visit.  3. Health maintenance: -Patient does not wish to under go DEXA scan or colonoscopy at this time. -She will follow up with her PCP.       Orders placed this encounter:  Orders Placed This Encounter  Procedures  . MM 3D SCREEN BREAST UNI RIGHT  . Lactate dehydrogenase  . CBC with Differential/Platelet  . Comprehensive metabolic panel  . Vitamin B12  . VITAMIN D 25 Hydroxy (Vit-D Deficiency, Fractures)      Francene Finders, FNP-C [facility]  [phone]  

## 2019-07-02 ENCOUNTER — Ambulatory Visit (HOSPITAL_COMMUNITY)
Admission: RE | Admit: 2019-07-02 | Discharge: 2019-07-02 | Disposition: A | Discharge status: Home / Self Care | Payer: BC Managed Care – PPO | Source: Ambulatory Visit | Attending: Nurse Practitioner | Admitting: Nurse Practitioner

## 2019-07-02 ENCOUNTER — Other Ambulatory Visit: Payer: Self-pay

## 2019-07-02 DIAGNOSIS — Z1231 Encounter for screening mammogram for malignant neoplasm of breast: Secondary | ICD-10-CM | POA: Diagnosis present

## 2019-07-03 ENCOUNTER — Other Ambulatory Visit (HOSPITAL_COMMUNITY): Payer: Self-pay | Admitting: Nurse Practitioner

## 2019-07-03 DIAGNOSIS — Z1231 Encounter for screening mammogram for malignant neoplasm of breast: Secondary | ICD-10-CM

## 2019-07-30 ENCOUNTER — Other Ambulatory Visit: Payer: Self-pay

## 2019-07-30 DIAGNOSIS — Z20822 Contact with and (suspected) exposure to covid-19: Secondary | ICD-10-CM

## 2019-08-01 LAB — NOVEL CORONAVIRUS, NAA: SARS-CoV-2, NAA: NOT DETECTED

## 2019-08-18 ENCOUNTER — Other Ambulatory Visit: Payer: Self-pay

## 2019-08-18 DIAGNOSIS — Z20822 Contact with and (suspected) exposure to covid-19: Secondary | ICD-10-CM

## 2019-08-20 LAB — NOVEL CORONAVIRUS, NAA: SARS-CoV-2, NAA: DETECTED — AB

## 2019-10-28 ENCOUNTER — Encounter (INDEPENDENT_AMBULATORY_CARE_PROVIDER_SITE_OTHER): Payer: Self-pay | Admitting: *Deleted

## 2020-03-30 ENCOUNTER — Ambulatory Visit (INDEPENDENT_AMBULATORY_CARE_PROVIDER_SITE_OTHER): Payer: BC Managed Care – PPO

## 2020-03-30 ENCOUNTER — Other Ambulatory Visit: Payer: Self-pay

## 2020-03-30 ENCOUNTER — Ambulatory Visit
Admission: EM | Admit: 2020-03-30 | Discharge: 2020-03-30 | Disposition: A | Discharge status: Home / Self Care | Payer: BC Managed Care – PPO | Attending: Emergency Medicine | Admitting: Emergency Medicine

## 2020-03-30 ENCOUNTER — Emergency Department (HOSPITAL_COMMUNITY)
Admission: EM | Admit: 2020-03-30 | Discharge: 2020-03-30 | Discharge status: Against Medical Advice | Payer: BC Managed Care – PPO

## 2020-03-30 DIAGNOSIS — M25572 Pain in left ankle and joints of left foot: Secondary | ICD-10-CM

## 2020-03-30 DIAGNOSIS — M79605 Pain in left leg: Secondary | ICD-10-CM

## 2020-03-30 DIAGNOSIS — Z23 Encounter for immunization: Secondary | ICD-10-CM | POA: Diagnosis not present

## 2020-03-30 DIAGNOSIS — M25521 Pain in right elbow: Secondary | ICD-10-CM

## 2020-03-30 DIAGNOSIS — M79662 Pain in left lower leg: Secondary | ICD-10-CM

## 2020-03-30 DIAGNOSIS — S82842A Displaced bimalleolar fracture of left lower leg, initial encounter for closed fracture: Secondary | ICD-10-CM

## 2020-03-30 DIAGNOSIS — S52131A Displaced fracture of neck of right radius, initial encounter for closed fracture: Secondary | ICD-10-CM

## 2020-03-30 MED ORDER — KETOROLAC TROMETHAMINE 30 MG/ML IJ SOLN
30.0000 mg | Freq: Once | INTRAMUSCULAR | Status: AC
  Administered 2020-03-30: 30 mg via INTRAMUSCULAR

## 2020-03-30 MED ORDER — IBUPROFEN 800 MG PO TABS: 800.0000 mg | ORAL_TABLET | Freq: Three times a day (TID) | ORAL | 0 refills | Status: DC

## 2020-03-30 MED ORDER — TETANUS-DIPHTH-ACELL PERTUSSIS 5-2.5-18.5 LF-MCG/0.5 IM SUSP
0.5000 mL | Freq: Once | INTRAMUSCULAR | Status: AC
  Administered 2020-03-30: 0.5 mL via INTRAMUSCULAR

## 2020-03-30 MED ORDER — TRAMADOL HCL 50 MG PO TABS: 50.0000 mg | ORAL_TABLET | Freq: Two times a day (BID) | ORAL | 0 refills | Status: AC | PRN

## 2020-03-30 NOTE — ED Provider Notes (Signed)
[facility]   [id number] 03/30/20 Arrival Time: [place]   Chief Complaint  Patient presents with  . Leg Injury  . Joint Swelling    SUBJECTIVE: History from: patient.  Rachel Harrell is a 59 y.o. female who presented to the urgent care with a complaint of left lower leg pain and swelling and right elbow pain that occured today.  She had a sheet rock fell on her left leg and sustained a fall.  Localized pain to the left lower leg and the right elbow.  Described the pain as constant and achy, rated a 7 on a scale 1-10.  Has not tried any OTC medication.  Symptoms are made worse with range of motion.  Denies similar symptoms in the past.  Denies chills, fever, nausea, vomiting, diarrhea. .     ROS: As per HPI.  All other pertinent ROS negative.      Past Medical History:  Diagnosis Date  . Breast cancer ([place]) 2007   IDC+DCIS of L breast; ER/PR+, Her2-  . 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 HYSTEROTOMY     1 ovary remains  . LAPAROSCOPY     endometrosis  . lt. mastectomy    . PORT WINE STAIN REMOVAL W/ LASER    . PORTACATH PLACEMENT     Allergies  Allergen Reactions  . Codeine Nausea And Vomiting  . Morphine And Related Nausea And Vomiting   No current facility-administered medications on file prior to encounter.   Current Outpatient Medications on File Prior to Encounter  Medication Sig Dispense Refill  . Calcium Carb-Cholecalciferol (CALTRATE 600+D) 600-800 MG-UNIT TABS Take 1 tablet by mouth 2 (two) times daily.    . ergocalciferol (VITAMIN D2) 1.25 MG (50000 UT) capsule Take 1 capsule (50,000 Units total) by mouth once a week. 16 capsule 4  . Melatonin 10 MG TABS Take 1 tablet by mouth every evening.    . Misc. Devices MISC Please provide patient with (7) seven mastectomy bras and (2) two prosthesis due to mastectomy 7 each 0  . Multiple Vitamin (MULTIVITAMIN) tablet Take 1 tablet by mouth  daily.       Social History   Socioeconomic History  . Marital status: Married    Spouse name: Not on file  . Number of children: Not on file  . Years of education: Not on file  . Highest education level: Not on file  Occupational History  . Not on file  Tobacco Use  . Smoking status: Never Smoker  . Smokeless tobacco: Never Used  Vaping Use  . Vaping Use: Never used  Substance and Sexual Activity  . Alcohol use: Yes    Comment: very occ  . Drug use: No  . Sexual activity: Not on file  Other Topics Concern  . Not on file  Social History Narrative  . Not on file   Social Determinants of Health   Financial Resource Strain:   . Difficulty of Paying Living Expenses:   Food Insecurity:   . Worried About Charity fundraiser in the Last Year:   . Arboriculturist in the Last Year:   Transportation Needs:   . Film/video editor (Medical):   Marland Kitchen Lack of Transportation (Non-Medical):   Physical Activity:   . Days of Exercise per Week:   . Minutes of Exercise per Session:   Stress:   . Feeling of Stress :  Social Connections:   . Frequency of Communication with Friends and Family:   . Frequency of Social Gatherings with Friends and Family:   . Attends Religious Services:   . Active Member of Clubs or Organizations:   . Attends Archivist Meetings:   Marland Kitchen Marital Status:   Intimate Partner Violence:   . Fear of Current or Ex-Partner:   . Emotionally Abused:   Marland Kitchen Physically Abused:   . Sexually Abused:    Family History  Problem Relation Age of Onset  . Alzheimer's disease Father   . Alzheimer's disease Maternal Grandmother   . Anesthesia problems Neg Hx     OBJECTIVE:  Vitals:   03/30/20 1818  BP: (!) 138/94  Pulse: 91  Resp: 18  Temp: 98.8 F (37.1 C)  SpO2: 97%     Physical Exam Vitals and nursing note reviewed.  Constitutional:      General: She is not in acute distress.    Appearance: Normal appearance. She is normal weight. She is not  ill-appearing, toxic-appearing or diaphoretic.  Cardiovascular:     Rate and Rhythm: Normal rate and regular rhythm.     Pulses: Normal pulses.     Heart sounds: Normal heart sounds. No murmur heard.  No friction rub. No gallop.   Pulmonary:     Effort: Pulmonary effort is normal. No respiratory distress.     Breath sounds: Normal breath sounds. No stridor. No wheezing, rhonchi or rales.  Chest:     Chest wall: No tenderness.  Musculoskeletal:        General: Tenderness present.     Right elbow: Tenderness present.     Left elbow: Normal.     Right lower leg: Normal.     Left lower leg: Swelling and tenderness present.     Comments: Right elbow: The right elbow is without any obvious asymmetry or deformity when compared to the left elbow.  There is no surface trauma, ecchymosis or open wound.  Limited range of motion due to pain.  Neurovascular status intact.  Left leg: Patient is unable to ambulate or bear weight.  The left leg is with obvious deformity when compared to the right leg.  Swelling, open wound and ecchymosis present.  Unable to perform range of motion.  Neurovascular status intact  Neurological:     Mental Status: She is alert.     LABS:  No results found for this or any previous visit (from the past 24 hour(s)).   ASSESSMENT & PLAN:  1. Right elbow pain   2. Left leg pain   3. Closed displaced bimalleolar fracture of left ankle, initial encounter   4. Closed displaced fracture of neck of right radius, initial encounter     Meds ordered this encounter  Medications  . traMADol (ULTRAM) 50 MG tablet    Sig: Take 1 tablet (50 mg total) by mouth every 12 (twelve) hours as needed for up to 3 days.    Dispense:  6 tablet    Refill:  0  . ibuprofen (ADVIL) 800 MG tablet    Sig: Take 1 tablet (800 mg total) by mouth 3 (three) times daily.    Dispense:  30 tablet    Refill:  0   Patient is stable at discharge.    Left ankle/foot and shoulder x-ray is positive  for acute fracture.  I have reviewed the x-ray myself and the radiologist interpretation.  I am in agreement with the radiologist interpretation.  Discharge  Instructions  Tramadol was prescribed for severe pain Ibuprofen 800 was prescribed for moderate pain Take the medication as prescribed Follow-up with orthopedic Follow RICE instruction that is attached Return or go to ED for worsening of symptoms   Reviewed expectations re: course of current medical issues. Questions answered. Outlined signs and symptoms indicating need for more acute intervention. Patient verbalized understanding. After Visit Summary given.         Emerson Monte, FNP 03/30/20 1944

## 2020-03-30 NOTE — ED Triage Notes (Signed)
Pt had 6 pieces of sheet rock fall on leg while at work, laceration to left lower leg with swelling and unable to bear weight, pt also reports right elbow pain from falling

## 2020-03-30 NOTE — Discharge Instructions (Addendum)
Tramadol was prescribed for severe pain Ibuprofen 800 was prescribed for moderate pain Take the medication as prescribed Follow-up with orthopedic Follow RICE instruction that is attached Return or go to ED for worsening of symptoms

## 2020-04-01 ENCOUNTER — Other Ambulatory Visit: Payer: Self-pay

## 2020-04-01 ENCOUNTER — Encounter: Payer: Self-pay | Admitting: Orthopedic Surgery

## 2020-04-01 ENCOUNTER — Ambulatory Visit (INDEPENDENT_AMBULATORY_CARE_PROVIDER_SITE_OTHER): Payer: BC Managed Care – PPO | Admitting: Orthopedic Surgery

## 2020-04-01 VITALS — BP 105/74 | HR 92 | Ht 69.0 in | Wt 160.0 lb

## 2020-04-01 DIAGNOSIS — S52134A Nondisplaced fracture of neck of right radius, initial encounter for closed fracture: Secondary | ICD-10-CM | POA: Diagnosis not present

## 2020-04-01 DIAGNOSIS — S82842A Displaced bimalleolar fracture of left lower leg, initial encounter for closed fracture: Secondary | ICD-10-CM | POA: Diagnosis not present

## 2020-04-01 MED ORDER — IBUPROFEN 800 MG PO TABS: 800.0000 mg | ORAL_TABLET | Freq: Three times a day (TID) | ORAL | 0 refills | Status: AC

## 2020-04-01 NOTE — Patient Instructions (Addendum)
May return to work work from home for 6 weeks   Ankle Fracture The ankle joint is made up of the lower (distal) sections of your lower leg bones (tibia and fibula) along with a bone in your foot (talus). An ankle fracture is a break in one, two, or all three of these sections of bone. There are two general types of ankle fractures:  Stable fracture. This happens when one of your bones is broken, but the bones of your ankle joint stay in their normal positions.  Unstable fracture. This type can include more than one broken bone. It can also happen if your outer bone is broken and the tough bands of tissue that connect bones (ligaments) are also injured at your inner ankle. This type of fracture allows the talus to move out of its normal position. What are the causes? This condition may be caused by:  A hard, direct hit (blow) to the ankle.  Quickly and severely twisting your ankle, often while your foot is planted and the rest of your body moving.  Trauma, such as a car accident or falling from a height. What increases the risk? This condition is more likely to occur in people who:  Smoke.  Are overweight.  Participate in sports that involve quick direction changes, as in soccer.  Do high-impact sports like gymnastics or football.  Are involved in a high-impact car accident. What are the signs or symptoms? Symptoms of this condition include:  Tender and swollen ankle.  Bruising around the injured ankle.  Pain when moving or pressing on the ankle.  Trouble walking or using the ankle to support your body weight (putting weight on the ankle).  Pain that gets worse when moving or standing and gets better with rest. How is this diagnosed? An ankle fracture is usually diagnosed with a physical exam and X-rays. A CT scan or MRI may also be done. How is this treated? Treatment for this condition depends on the type of ankle fracture you have. Stable fractures are treated with a  cast, boot, or splint to hold the ankle still and crutches to avoid putting weight on the injured ankle until the fracture heals. Unstable fractures require surgery to ensure that the bones heal properly. After surgery, you will have a splint. After your incision is healed, your surgeon may give you a cast or a boot. You will not be able to put weight on your injured side for several weeks. After your ankle has healed, you will do exercises to improve the strength and mobility of your ankle. Follow these instructions at home: If you have a splint:  Wear the splint as told by your health care provider. Remove it only as told by your health care provider.  Loosen the splint if your toes tingle, become numb, or turn cold and blue.  Keep the splint clean.  If the splint is not waterproof: ? Do not let it get wet. ? Cover it with a watertight covering when you take a bath or a shower. If you have a cast:  Do not stick anything inside the cast to scratch your skin. Doing that increases your risk of infection.  Check the skin around the cast every day. Tell your health care provider about any concerns.  You may put lotion on dry skin around the edges of the cast. Do not put lotion on the skin underneath the cast.  Keep the cast clean.  If the cast is not waterproof: ? Do  not let it get wet. ? Cover it with a watertight covering when you take a bath or a shower. Managing pain, stiffness, and swelling   If directed, put ice on the injured area: ? If you have a removable splint, remove it as told by your health care provider. ? Put ice in a plastic bag. ? Place a towel between your skin and the bag or between your cast and the bag. ? Leave the ice on for 20 minutes, 2-3 times a day.  Move your toes often to avoid stiffness and to lessen swelling.  Raise (elevate) the injured area above the level of your heart while you are sitting or lying down. General instructions  Do not use the  injured limb to support your body weight until your health care provider says that you can. Use crutches as told by your health care provider  Take over-the-counter and prescription medicines only as told by your health care provider.  Ask your health care provider when it is safe to drive if you have a cast or splint.  Do exercises as told by your health care provider.  Do not use any products that contain nicotine or tobacco, such as cigarettes and e-cigarettes. These can delay bone healing. If you need help quitting, ask your health care provider  Keep all follow-up visits as told by your health care provider. This is important. Contact a health care provider if:  You have pain or swelling that gets worse or does not get better with rest or medicine. Get help right away if:  Your cast gets damaged.  You have severe pain that lasts.  You develop new pain or swelling.  Your skin or toenails below the injury turn blue or gray, feel cold, become numb, or have a loss of sensitivity to touch. Summary  An ankle fracture can either be stable or unstable. This is determined after a physical exam and imaging studies like X-rays, a CT scan, or MRI.  Stable fractures are treated with a cast, boot, or splint to hold the ankle still until the fracture heals. Unstable fractures require surgery to ensure that the bones heal properly.  You will not be able to put weight on your injured side for several weeks.  Pain medicines, icing, and raising (elevating) your injured ankle when sitting or lying down may help with pain relief. Follow instructions as told by your health care provider. This information is not intended to replace advice given to you by your health care provider. Make sure you discuss any questions you have with your health care provider. Document Revised: 12/04/2018 Document Reviewed: 10/26/2016 Elsevier Patient Education  2020 [place].  Radial Head Fracture  A radial  head fracture is a break in the smaller bone in your forearm (radius). The break happens near the end of the bone at the elbow joint. There are two bones in your forearm. The radius, or radial bone, is the bone on the side of your thumb. There are different types of radial head fractures. The type depends on how much the bones have moved (been displaced) from their normal positions:  Type 1. This is a small fracture in which the bone pieces stay together (nondisplaced fracture).  Type 2. The fracture has bone pieces that are slightly moved.  Type 3. There are many fractures and moved bone pieces. What are the causes? This condition is often caused by falling and landing on an outstretched arm. What increases the risk? You are  more likely to get this condition if you:  Are female.  Are 52-17 years old.  Have weak bones (osteoporosis). What are the signs or symptoms?  Swelling of the elbow joint.  Pain and trouble moving the elbow joint. How is this treated? Treatment for this condition may include:  Resting your arm.  Icing your arm.  Raising (elevating) the injured area above the level of your heart.  Taking medicines to help with the pain. Treatment depends on the type of fracture you have.  For type 1, you may be given a splint or sling to keep your arm and elbow from moving for several days.  For type 2, you may be given a splint or sling to keep your arm and elbow from moving for several days. If the bones have moved a lot, you may need surgery.  For type 3, you will often need surgery to have bone pieces removed. The entire radial head may need to be removed if the damage is very bad. Follow these instructions at home: Medicines  Take over-the-counter and prescription medicines only as told by your doctor.  Ask your doctor if the medicine prescribed to you: ? Requires you to avoid driving or using heavy machinery. ? Can cause trouble pooping (constipation). You may  need to take these actions to prevent or treat trouble pooping:  Drink enough fluid to keep your pee (urine) pale yellow.  Take over-the-counter or prescription medicines.  Eat foods that are high in fiber. These include beans, whole grains, and fresh fruits and vegetables.  Limit foods that are high in fat and sugar. These include fried or sweet foods. If you have a splint or sling:  Wear the splint or sling as told by your doctor. Remove it only as told by your doctor.  Loosen it if your fingers: ? Tingle. ? Become numb. ? Turn cold and blue.  Keep it clean and dry. If you have a splint:  Do not put pressure on any part of the splint until it is fully hardened. This may take several hours.  Check the skin around the splint every day. Tell your doctor about any concerns. Bathing  Do not take baths, swim, or use a hot tub until your doctor approves. Ask your doctor if you may take showers. You may only be allowed to take sponge baths.  If the splint or sling is not waterproof: ? Do not let it get wet. ? Cover it with a watertight covering when you take a bath or shower. Managing pain, stiffness, and swelling   If told, put ice on the injured area. ? If you have a removable splint or sling, remove it as told by your doctor. ? Put ice in a plastic bag. ? Place a towel between your skin and the bag. ? Leave the ice on for 20 minutes, 2-3 times a day.  Move your fingers often.  Raise the injured area above the level of your heart while you are sitting or lying down. Activity  Ask your doctor when it is safe to drive if you have a splint or sling on your arm.  Do not lift anything that is heavier than 10 lb (4.5 kg), or the limit that you are told, until your doctor says that it is safe.  Return to your normal activities as told by your doctor. Ask your doctor what activities are safe for you.  Do exercises as told by your doctor or physical therapist. General  instructions  Do not use any products that contain nicotine or tobacco, such as cigarettes, e-cigarettes, and chewing tobacco. If you need help quitting, ask your doctor.  Keep all follow-up visits as told by your doctor. This is important. Contact a doctor if:  You have problems with your splint.  You have pain or swelling that gets worse. Get help right away if:  You have very bad pain.  You have fluid or a bad smell coming from your splint.  Your hand or fingers get cold or turn pale or blue.  You lose feeling in any part of your hand or arm. Summary  A radial head fracture is a break in the smaller bone in your forearm (radius). The break happens near the end of the bone at the elbow joint.  This break is often caused by falling and landing on an outstretched arm.  This condition may be treated with rest, ice, raising the arm, pain medicines, a splint or sling, and surgery. Treatment depends on the type of fracture you have. This information is not intended to replace advice given to you by your health care provider. Make sure you discuss any questions you have with your health care provider. Document Revised: 10/02/2018 Document Reviewed: 10/02/2018 Elsevier Patient Education  2020 [employer].

## 2020-04-01 NOTE — Progress Notes (Signed)
NEW PROBLEM//OFFICE VISIT  Chief Complaint  Patient presents with  . Ankle Pain    L/DOI 03/30/20/slabs of sheet rock fell on me hit my ankle and elbow  . Elbow Pain    R/ doi 03/30/20/went to Urgent Care    59 year old female employed at [employer].  She was at work and sheet rock fell on her and knocked her down.  She sustained injuries to her left thigh with contusion, left ankle bimalleolar fracture, radial head fracture right elbow  Presents with pain and swelling in the right elbow and left ankle had a bruise over the left thigh   Review of Systems  Neurological: Negative for tingling.  All other systems reviewed and are negative.    Past Medical History:  Diagnosis Date  . Breast cancer ([place]) 2007   IDC+DCIS of L breast; ER/PR+, Her2-  . 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 HYSTEROTOMY     1 ovary remains  . LAPAROSCOPY     endometrosis  . lt. mastectomy    . PORT WINE STAIN REMOVAL W/ LASER    . PORTACATH PLACEMENT      Family History  Problem Relation Age of Onset  . Alzheimer's disease Father   . Alzheimer's disease Maternal Grandmother   . Anesthesia problems Neg Hx    Social History   Tobacco Use  . Smoking status: Never Smoker  . Smokeless tobacco: Never Used  Vaping Use  . Vaping Use: Never used  Substance Use Topics  . Alcohol use: Yes    Comment: very occ  . Drug use: No    Allergies  Allergen Reactions  . Codeine Nausea And Vomiting  . Morphine And Related Nausea And Vomiting    No outpatient medications have been marked as taking for the 04/01/20 encounter (Office Visit) with Carole Civil, MD.    BP 105/74   Pulse 92   Ht _0  (1.753 m)   Wt 160 lb (72.6 kg)   BMI 23.63 kg/m   Physical Exam Constitutional:      General: She is not in acute distress.    Appearance: She is well-developed.  Cardiovascular:     Comments: No  peripheral edema Skin:    General: Skin is warm and dry.  Neurological:     Mental Status: She is alert and oriented to person, place, and time.     Sensory: No sensory deficit.     Coordination: Coordination normal.     Gait: Gait abnormal.     Deep Tendon Reflexes: Reflexes are normal and symmetric.     Ortho Exam  Right elbow Skin is intact Lateral epicondyle and radial head and forearm are tender There is decreased range of motion at the elbow joint Wrist and elbow are stable Muscle tone is normal Neurovascular exam is intact  Left ankle Tenderness and swelling medially and laterally with bruising and subcutaneous ecchymosis and there is an abrasion on the lateral side of the leg and over the anterior compartment it is superficial Ankle range of motion is limited by pain Instability none on x-ray Muscle tone normal Neurovascular exam is intact  Left thigh has a bruise over the medial side and this is tender as well no knee effusion   Encounter Diagnoses  Name Primary?  . Closed nondisplaced fracture of neck of right radius, initial encounter   . Closed bimalleolar fracture of left ankle, initial  encounter Yes     MEDICAL DECISION MAKING  A.  Encounter Diagnoses  Name Primary?  . Closed nondisplaced fracture of neck of right radius, initial encounter   . Closed bimalleolar fracture of left ankle, initial encounter Yes    B. DATA ANALYSED:   IMAGING: Independent interpretation of images: Nondisplaced bimalleolar ankle fracture the left and a nondisplaced radial head neck fracture on the right  Orders:   Outside records reviewed: Urgent care records  C. MANAGEMENT   #1 Cam walker and dressing left ankle #2 anti-inflammatories contusion left thigh #3 posterior splint right elbow  Recommend platform walker and knee walker  Recommend work from home only for 6 weeks  X-ray in 1 week as this fracture could displace at the ankle requiring surgery patient  aware  Meds ordered this encounter  Medications  . ibuprofen (ADVIL) 800 MG tablet    Sig: Take 1 tablet (800 mg total) by mouth 3 (three) times daily.    Dispense:  30 tablet    Refill:  0       Meds ordered this encounter  Medications  . ibuprofen (ADVIL) 800 MG tablet    Sig: Take 1 tablet (800 mg total) by mouth 3 (three) times daily.    Dispense:  30 tablet    Refill:  0      Arther Abbott, MD  04/01/2020 9:17 AM

## 2020-04-05 DIAGNOSIS — S52136D Nondisplaced fracture of neck of unspecified radius, subsequent encounter for closed fracture with routine healing: Secondary | ICD-10-CM | POA: Insufficient documentation

## 2020-04-05 DIAGNOSIS — S82842A Displaced bimalleolar fracture of left lower leg, initial encounter for closed fracture: Secondary | ICD-10-CM | POA: Insufficient documentation

## 2020-04-08 ENCOUNTER — Ambulatory Visit: Payer: BC Managed Care – PPO

## 2020-04-08 ENCOUNTER — Other Ambulatory Visit: Payer: Self-pay

## 2020-04-08 ENCOUNTER — Ambulatory Visit (INDEPENDENT_AMBULATORY_CARE_PROVIDER_SITE_OTHER): Payer: BC Managed Care – PPO | Admitting: Orthopedic Surgery

## 2020-04-08 ENCOUNTER — Encounter: Payer: Self-pay | Admitting: Orthopedic Surgery

## 2020-04-08 DIAGNOSIS — S82842D Displaced bimalleolar fracture of left lower leg, subsequent encounter for closed fracture with routine healing: Secondary | ICD-10-CM

## 2020-04-08 DIAGNOSIS — S52134D Nondisplaced fracture of neck of right radius, subsequent encounter for closed fracture with routine healing: Secondary | ICD-10-CM

## 2020-04-08 NOTE — Patient Instructions (Signed)
3 x a day elbow exercises

## 2020-04-08 NOTE — Progress Notes (Signed)
Chief Complaint  Patient presents with  . Follow-up    Recheck on left ankle fracture and right elbow fracture, DOI 03-30-20.   9 days post injury   Encounter Diagnoses  Name Primary?  . Closed nondisplaced fracture of neck of right radius with routine healing, subsequent encounter 03/30/20 Yes  . Closed bimalleolar fracture of left ankle with routine healing, subsequent encounter 03/30/20     The radial neck/head fracture is healing well.  We placed her in a sling she was instructed on active range of motion exercises  The ankle fracture is nondisplaced on both medial and lateral malleoli  Recommend cam walker continue  Follow-up in a week repeat x-ray of the ankle

## 2020-04-15 ENCOUNTER — Other Ambulatory Visit: Payer: Self-pay

## 2020-04-15 ENCOUNTER — Ambulatory Visit (INDEPENDENT_AMBULATORY_CARE_PROVIDER_SITE_OTHER): Payer: BC Managed Care – PPO | Admitting: Orthopedic Surgery

## 2020-04-15 ENCOUNTER — Ambulatory Visit: Payer: BC Managed Care – PPO

## 2020-04-15 ENCOUNTER — Encounter: Payer: Self-pay | Admitting: Orthopedic Surgery

## 2020-04-15 VITALS — BP 131/90 | HR 92 | Ht 69.0 in | Wt 160.0 lb

## 2020-04-15 DIAGNOSIS — S52134D Nondisplaced fracture of neck of right radius, subsequent encounter for closed fracture with routine healing: Secondary | ICD-10-CM | POA: Diagnosis not present

## 2020-04-15 DIAGNOSIS — S82842D Displaced bimalleolar fracture of left lower leg, subsequent encounter for closed fracture with routine healing: Secondary | ICD-10-CM

## 2020-04-15 NOTE — Progress Notes (Signed)
Chief Complaint  Patient presents with  . Follow-up    fractured radial head right ankle left 03/30/20   Encounter Diagnoses  Name Primary?  . Closed nondisplaced fracture of neck of right radius with routine healing, subsequent encounter 03/30/20 Yes  . Closed bimalleolar fracture of left ankle with routine healing, subsequent encounter 03/30/20     Ankle is slightly swollen but in good alignment as is the elbow she is lacking some extension in the elbow as expected  X-rays of the elbow show the fracture is healing and the ankle is stable  Recommend x-ray in a week and then if things look good we can start weightbearing in the cam walker

## 2020-04-18 ENCOUNTER — Ambulatory Visit: Payer: BC Managed Care – PPO | Admitting: Orthopedic Surgery

## 2020-04-22 ENCOUNTER — Ambulatory Visit: Payer: BC Managed Care – PPO | Admitting: Orthopedic Surgery

## 2020-04-22 ENCOUNTER — Other Ambulatory Visit: Payer: Self-pay

## 2020-04-22 ENCOUNTER — Ambulatory Visit (INDEPENDENT_AMBULATORY_CARE_PROVIDER_SITE_OTHER): Payer: BC Managed Care – PPO | Admitting: Orthopedic Surgery

## 2020-04-22 ENCOUNTER — Ambulatory Visit: Payer: BC Managed Care – PPO

## 2020-04-22 DIAGNOSIS — S82842D Displaced bimalleolar fracture of left lower leg, subsequent encounter for closed fracture with routine healing: Secondary | ICD-10-CM | POA: Diagnosis not present

## 2020-04-22 NOTE — Progress Notes (Signed)
Patient ID: Rachel Harrell, female   DOB: 06-15-1961, 59 y.o.   MRN: [id number]  FRACTURE CARE   Chief Complaint  Patient presents with  . Ankle Pain    L/some pain at times/    Encounter Diagnosis  Name Primary?  . Closed bimalleolar fracture of left ankle with routine healing, subsequent encounter Yes    FOV: July 25  CURRENT TREATMENT : Cam walker for the ankle fracture on the left, splint and sling now removed and active range of motion for the elbow injury on the right  POST INJURY DAY: March 30, 2020    Allowing weightbearing as tolerated as today's x-ray shows no change in the mortise view.  This was compared to the previous and initial x-ray.  She is weightbearing as tolerated with a platform walker for 1 week and then transition to a regular walker  X-ray in 2 weeks  Explained that almost 0 chance of this fracture displacing but if it does we can go in and fix the fractures  Encounter Diagnosis  Name Primary?  . Closed bimalleolar fracture of left ankle with routine healing, subsequent encounter Yes

## 2020-05-06 ENCOUNTER — Encounter: Payer: Self-pay | Admitting: Orthopedic Surgery

## 2020-05-06 ENCOUNTER — Ambulatory Visit (INDEPENDENT_AMBULATORY_CARE_PROVIDER_SITE_OTHER): Payer: BC Managed Care – PPO | Admitting: Orthopedic Surgery

## 2020-05-06 ENCOUNTER — Ambulatory Visit: Payer: BC Managed Care – PPO

## 2020-05-06 ENCOUNTER — Other Ambulatory Visit: Payer: Self-pay

## 2020-05-06 DIAGNOSIS — S82842D Displaced bimalleolar fracture of left lower leg, subsequent encounter for closed fracture with routine healing: Secondary | ICD-10-CM

## 2020-05-06 DIAGNOSIS — S52134D Nondisplaced fracture of neck of right radius, subsequent encounter for closed fracture with routine healing: Secondary | ICD-10-CM

## 2020-05-06 NOTE — Progress Notes (Signed)
Chief Complaint  Patient presents with  . Follow-up    Recheck on left ankle fracture, DOI 03-30-20.   Left bimalleolar fracture right radial head neck fracture  Still having some discomfort in the radial head and elbow using the walker and occasionally the platform.  Left ankle doing better but stiff I showed her some exercises to do for that  Her x-ray looks great there is been no displacement of the fracture ankle mortise is good fracture is healing  Encounter Diagnoses  Name Primary?  . Closed bimalleolar fracture of left ankle with routine healing, subsequent encounter 03/30/20 Yes  . Closed nondisplaced fracture of neck of right radius with routine healing, subsequent encounter 03/30/20     Continue weightbearing as tolerated in the cam walker return to work on the ninth return here in 4 weeks for 10-week x-ray of the ankle

## 2020-05-06 NOTE — Progress Notes (Signed)
Fracture care   Chief Complaint  Patient presents with  . Follow-up    Recheck on left ankle fracture, DOI 03-30-20.    Encounter Diagnosis  Name Primary?  . Closed bimalleolar fracture of left ankle with routine healing, subsequent encounter 03/30/20 Yes

## 2020-06-03 ENCOUNTER — Ambulatory Visit: Payer: BC Managed Care – PPO

## 2020-06-03 ENCOUNTER — Encounter: Payer: Self-pay | Admitting: Orthopedic Surgery

## 2020-06-03 ENCOUNTER — Other Ambulatory Visit: Payer: Self-pay

## 2020-06-03 ENCOUNTER — Ambulatory Visit (INDEPENDENT_AMBULATORY_CARE_PROVIDER_SITE_OTHER): Payer: BC Managed Care – PPO | Admitting: Orthopedic Surgery

## 2020-06-03 ENCOUNTER — Telehealth: Payer: Self-pay | Admitting: Radiology

## 2020-06-03 DIAGNOSIS — S82842D Displaced bimalleolar fracture of left lower leg, subsequent encounter for closed fracture with routine healing: Secondary | ICD-10-CM

## 2020-06-03 DIAGNOSIS — S52134D Nondisplaced fracture of neck of right radius, subsequent encounter for closed fracture with routine healing: Secondary | ICD-10-CM

## 2020-06-03 NOTE — Telephone Encounter (Signed)
Can you check with WC about the Left ankle physical therapy ?

## 2020-06-03 NOTE — Patient Instructions (Addendum)
You may remove the cam walker   We will Schedule PT

## 2020-06-03 NOTE — Progress Notes (Signed)
Fracture care   Chief Complaint  Patient presents with  . Ankle Injury    03/30/20 left ankle    59 year old female with bimalleolar ankle fracture sustained at work doing well complains of some swelling and stiffness  X-rays show fracture healing  Ankle range of motion is limited and ankle is swollen fracture sites are nontender medially and laterally  She has some numbness along the anterolateral portion of the distal lower leg  Recommend physical therapy  She can remove the boot  Follow-up in 8 weeks  X-ray

## 2020-06-03 NOTE — Addendum Note (Signed)
Addended byCandice Camp on: 06/03/2020 09:07 AM   Modules accepted: Orders

## 2020-06-06 NOTE — Telephone Encounter (Signed)
[facility] office staff has never been provided with any insurance information other than patient's medical coverage; therefore, no worker's comp information to check on regarding physical therapy.

## 2020-06-06 NOTE — Telephone Encounter (Signed)
She told us first visit is Workers comp She had sheet rock fall on her, she works at [employer] you call her to get information?

## 2020-06-06 NOTE — Telephone Encounter (Signed)
I called patient after speaking with clinic supervisor Abigail Butts in regard to patient's insurance information provided and verified at each of the patient's scheduled visits with Dr Aline Brochure, which was [facility]. Reached voice mail, left message.

## 2020-06-08 NOTE — Telephone Encounter (Signed)
Patient has returned call; states she is not under worker's comp; states her insurer is as noted,BCBS. States therapy at [facility] out-patient rehab would be fine if orders can be sent there.

## 2020-06-08 NOTE — Telephone Encounter (Signed)
Thank you Carol!!

## 2020-06-29 ENCOUNTER — Other Ambulatory Visit (HOSPITAL_COMMUNITY): Payer: Self-pay

## 2020-06-29 ENCOUNTER — Other Ambulatory Visit: Payer: Self-pay

## 2020-06-29 ENCOUNTER — Ambulatory Visit (HOSPITAL_COMMUNITY): Payer: BC Managed Care – PPO | Attending: Orthopedic Surgery | Admitting: Physical Therapy

## 2020-06-29 ENCOUNTER — Encounter (HOSPITAL_COMMUNITY): Payer: Self-pay | Admitting: Physical Therapy

## 2020-06-29 DIAGNOSIS — M25572 Pain in left ankle and joints of left foot: Secondary | ICD-10-CM

## 2020-06-29 DIAGNOSIS — R29898 Other symptoms and signs involving the musculoskeletal system: Secondary | ICD-10-CM | POA: Insufficient documentation

## 2020-06-29 DIAGNOSIS — M6281 Muscle weakness (generalized): Secondary | ICD-10-CM | POA: Diagnosis present

## 2020-06-29 DIAGNOSIS — R2689 Other abnormalities of gait and mobility: Secondary | ICD-10-CM | POA: Insufficient documentation

## 2020-06-29 DIAGNOSIS — Z1231 Encounter for screening mammogram for malignant neoplasm of breast: Secondary | ICD-10-CM

## 2020-06-29 DIAGNOSIS — E559 Vitamin D deficiency, unspecified: Secondary | ICD-10-CM

## 2020-06-29 DIAGNOSIS — C50919 Malignant neoplasm of unspecified site of unspecified female breast: Secondary | ICD-10-CM

## 2020-06-29 NOTE — Patient Instructions (Signed)
Access Code: [email or web address] URL: [email or web address] Date: 06/29/2020 Prepared by: [facility] Medical illustrator on Wall - 3 x daily - 7 x weekly - 3 reps - 30 seconds hold Heel Toe Raises with Counter Support - 1 x daily - 7 x weekly - 2 sets - 10 reps Single Leg Stance - 1 x daily - 7 x weekly - 3 reps - 30 second hold

## 2020-06-29 NOTE — Therapy (Signed)
[place], [place], [address] Phone: [phone]   Fax:  [phone]  Physical Therapy Evaluation  Patient Details  Name: Rachel Harrell MRN: [id number] Date of Birth: [birth year] Referring Provider (PT): Arther Abbott MD   Encounter Date: 06/29/2020   PT End of Session - 06/29/20 1618    Visit Number 1    Number of Visits 1    Date for PT Re-Evaluation 06/29/20    Authorization Type BCBS    Authorization - Visit Number 1    Authorization - Number of Visits 30    PT Start Time 2992    PT Stop Time 4268    PT Time Calculation (min) 45 min    Activity Tolerance Patient tolerated treatment well    Behavior During Therapy [facility] for tasks assessed/performed           Past Medical History:  Diagnosis Date  . Breast cancer ([place]) 2007   IDC+DCIS of L breast; ER/PR+, Her2-  . 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 HYSTEROTOMY     1 ovary remains  . LAPAROSCOPY     endometrosis  . lt. mastectomy    . PORT WINE STAIN REMOVAL W/ LASER    . PORTACATH PLACEMENT      There were no vitals filed for this visit.    Subjective Assessment - 06/29/20 1446    Subjective Patient is a 59 y.o. female who presents to physical therapy with L ankle pain s/p closed bimalleolar fracture of L ankle. Patient states she has been out of boot for 3 weeks. She is mainly having problems with swelling and ROM. Pain is minimal and main issue is stiffness. Patient typically walks a lot but she has not been able to do that as much. She has gotten back to doing stairs with alternating pattern. She has been using ice and ibuprofen. Patient states this began June 23 when sheetrock fell on her ankle.    How long can you walk comfortably? 4 hours while working    Patient Stated Goals learn some stretching and improve mobility to walk unrestricted    Currently in Pain?  No/denies              [facility] PT Assessment - 06/29/20 0001      Assessment   Medical Diagnosis Closed bimalleolar fracture of L ankle    Referring Provider (PT) Arther Abbott MD    Onset Date/Surgical Date 03/30/20    Next MD Visit next week    Prior Therapy none      Precautions   Precautions None      Restrictions   Weight Bearing Restrictions No      Balance Screen   Has the patient fallen in the past 6 months Yes    How many times? 1    Has the patient had a decrease in activity level because of a fear of falling?  No    Is the patient reluctant to leave their home because of a fear of falling?  No      Prior Function   Level of Independence Independent    Vocation Full time employment    Vocation Requirements Professor    Leisure walking      Cognition   Overall Cognitive Status Within Functional Limits for tasks assessed      Observation/Other Assessments   Observations Ambulates without  AD, L ankle edema     Focus on Therapeutic Outcomes (FOTO)  34% limited      ROM / Strength   AROM / PROM / Strength AROM;Strength      AROM   AROM Assessment Site Knee;Ankle    Right/Left Ankle Right;Left    Right Ankle Dorsiflexion 10    Right Ankle Plantar Flexion 43    Right Ankle Inversion 50    Right Ankle Eversion 20    Left Ankle Dorsiflexion 3    Left Ankle Plantar Flexion 40    Left Ankle Inversion 20    Left Ankle Eversion 15      Strength   Strength Assessment Site Knee;Ankle    Right/Left Knee Right;Left    Right Knee Flexion 5/5    Right Knee Extension 5/5    Left Knee Flexion 5/5    Left Knee Extension 5/5    Right/Left Ankle Right;Left    Right Ankle Dorsiflexion 5/5    Right Ankle Inversion 5/5    Right Ankle Eversion 5/5    Left Ankle Dorsiflexion 4+/5    Left Ankle Inversion 4/5    Left Ankle Eversion 4-/5      Palpation   Palpation comment minimal tenderness throughout L ankle, decreased sensation lateral anterior ankle, edema       Ambulation/Gait   Ambulation/Gait Yes    Ambulation/Gait Assistance 7: Independent    Ambulation Distance (Feet) 500 Feet    Assistive device None                      Objective measurements completed on examination: See above findings.       [facility] Adult PT Treatment/Exercise - 06/29/20 0001      Ambulation/Gait   Stairs Yes    Stairs Assistance 7: Independent    Gait Comments 2 MWT antalgic with increased distance; decreased DF ROM on LLE when decending causing early heel off      Exercises   Exercises Ankle      Ankle Exercises: Stretches   Gastroc Stretch 3 reps;30 seconds      Ankle Exercises: Standing   SLS 2x 30 seconds    Heel Raises Both;10 reps    Heel Raises Limitations 2 sets    Toe Raise 10 reps    Toe Raise Limitations 2 sets                  PT Education - 06/29/20 1454    Education Details Patient educated on exam findings, POC, scope of PT, HEP, compression garments, returning to PT if needed    Person(s) Educated Patient    Methods Explanation;Demonstration;Handout    Comprehension Verbalized understanding;Returned demonstration            PT Short Term Goals - 06/29/20 1625      PT SHORT TERM GOAL #1   Title Patient will be educated on HEP and returning to PT if necessary to further increase functional mobility for ADL    Time 1    Period Weeks    Status New    Target Date 06/29/20                     Plan - 06/29/20 1620    Clinical Impression Statement Patient is a 59 y.o. female who presents to physical therapy with L ankle pain s/p closed bimalleolar fracture of L ankle suffered on June 23. She presents with pain limited deficits  in L ankle strength, ROM, edema, gait, stairs and functional mobility with ADL. She is having to modify and restrict ADL as indicated by FOTO score as well as subjective information and objective measures which is affecting overall participation. Patient has overall been progressing  well since discontinuing use of brace. Patient shown and able to complete exercises today to improve L ankle ROM, balance, and strength. Patient given information and handout for compression garments to reduce LE edema due to extended periods of standing/walking at work. Patient agreeable to HEP and discussed with patient returning to physical therapy if needed. Patient does not require any additional PT services at this time.    Personal Factors and Comorbidities Age;Time since onset of injury/illness/exacerbation;Profession    Examination-Activity Limitations Bend;Locomotion Level;Squat;Stairs    Examination-Participation Restrictions Occupation;Community Activity;Cleaning;Volunteer;Yard Work    Stability/Clinical Decision Making Stable/Uncomplicated    Clinical Decision Making Low    Rehab Potential Good    PT Frequency One time visit    PT Treatment/Interventions Gait training;Stair training;Functional mobility training;Therapeutic exercise;Balance training;Therapeutic activities;Patient/family education;Neuromuscular re-education;ADLs/Self Care Home Management;DME Instruction;Joint Manipulations;Manual techniques    PT Next Visit Plan n/a    PT Home Exercise Plan heel raises, toe raises, calf stretch, SLS    Consulted and Agree with Plan of Care Patient           Patient will benefit from skilled therapeutic intervention in order to improve the following deficits and impairments:  Abnormal gait, Decreased range of motion, Difficulty walking, Decreased activity tolerance, Decreased balance, Decreased strength, Increased edema, Pain, Impaired flexibility  Visit Diagnosis: Pain in left ankle and joints of left foot  Muscle weakness (generalized)  Other abnormalities of gait and mobility  Other symptoms and signs involving the musculoskeletal system     Problem List Patient Active Problem List   Diagnosis Date Noted  . Closed nondisplaced fracture of neck of radius with routine  healing 03/30/20 04/05/2020  . Closed bimalleolar fracture of left ankle 03/30/20 04/05/2020  . Genetic testing 12/29/2015  . 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

## 2020-06-30 ENCOUNTER — Other Ambulatory Visit: Payer: Self-pay

## 2020-06-30 ENCOUNTER — Inpatient Hospital Stay (HOSPITAL_COMMUNITY): Payer: BC Managed Care – PPO | Attending: Hematology

## 2020-06-30 DIAGNOSIS — E559 Vitamin D deficiency, unspecified: Secondary | ICD-10-CM | POA: Diagnosis not present

## 2020-06-30 DIAGNOSIS — Z923 Personal history of irradiation: Secondary | ICD-10-CM | POA: Diagnosis not present

## 2020-06-30 DIAGNOSIS — Z9221 Personal history of antineoplastic chemotherapy: Secondary | ICD-10-CM | POA: Diagnosis not present

## 2020-06-30 DIAGNOSIS — Z853 Personal history of malignant neoplasm of breast: Secondary | ICD-10-CM | POA: Diagnosis not present

## 2020-06-30 DIAGNOSIS — Z79899 Other long term (current) drug therapy: Secondary | ICD-10-CM | POA: Insufficient documentation

## 2020-06-30 DIAGNOSIS — C50919 Malignant neoplasm of unspecified site of unspecified female breast: Secondary | ICD-10-CM

## 2020-06-30 DIAGNOSIS — Z1231 Encounter for screening mammogram for malignant neoplasm of breast: Secondary | ICD-10-CM

## 2020-06-30 LAB — CBC WITH DIFFERENTIAL/PLATELET
Abs Immature Granulocytes: 0.04 10*3/uL (ref 0.00–0.07)
Basophils Absolute: 0.1 10*3/uL (ref 0.0–0.1)
Basophils Relative: 1 %
Eosinophils Absolute: 0.3 10*3/uL (ref 0.0–0.5)
Eosinophils Relative: 3 %
HCT: 42.1 % (ref 36.0–46.0)
Hemoglobin: 13.6 g/dL (ref 12.0–15.0)
Immature Granulocytes: 0 %
Lymphocytes Relative: 21 %
Lymphs Abs: 1.9 10*3/uL (ref 0.7–4.0)
MCH: 28.5 pg (ref 26.0–34.0)
MCHC: 32.3 g/dL (ref 30.0–36.0)
MCV: 88.3 fL (ref 80.0–100.0)
Monocytes Absolute: 0.8 10*3/uL (ref 0.1–1.0)
Monocytes Relative: 8 %
Neutro Abs: 6.1 10*3/uL (ref 1.7–7.7)
Neutrophils Relative %: 67 %
Platelets: 270 10*3/uL (ref 150–400)
RBC: 4.77 MIL/uL (ref 3.87–5.11)
RDW: 11.9 % (ref 11.5–15.5)
WBC: 9.3 10*3/uL (ref 4.0–10.5)
nRBC: 0 % (ref 0.0–0.2)

## 2020-06-30 LAB — COMPREHENSIVE METABOLIC PANEL
ALT: 17 U/L (ref 0–44)
AST: 20 U/L (ref 15–41)
Albumin: 4.2 g/dL (ref 3.5–5.0)
Alkaline Phosphatase: 69 U/L (ref 38–126)
Anion gap: 8 (ref 5–15)
BUN: 15 mg/dL (ref 6–20)
CO2: 26 mmol/L (ref 22–32)
Calcium: 8.8 mg/dL — ABNORMAL LOW (ref 8.9–10.3)
Chloride: 105 mmol/L (ref 98–111)
Creatinine, Ser: 0.81 mg/dL (ref 0.44–1.00)
GFR calc Af Amer: >60 mL/min (ref >60)
GFR calc non Af Amer: >60 mL/min (ref >60)
Glucose, Bld: 104 mg/dL — ABNORMAL HIGH (ref 70–99)
Potassium: 3.7 mmol/L (ref 3.5–5.1)
Sodium: 139 mmol/L (ref 135–145)
Total Bilirubin: 0.7 mg/dL (ref 0.3–1.2)
Total Protein: 7.4 g/dL (ref 6.5–8.1)

## 2020-06-30 LAB — VITAMIN D 25 HYDROXY (VIT D DEFICIENCY, FRACTURES): Vit D, 25-Hydroxy: 28.92 ng/mL — ABNORMAL LOW (ref 30–100)

## 2020-06-30 LAB — VITAMIN B12: Vitamin B-12: 337 pg/mL (ref 180–914)

## 2020-06-30 LAB — LACTATE DEHYDROGENASE: LDH: 156 U/L (ref 98–192)

## 2020-07-07 ENCOUNTER — Inpatient Hospital Stay (HOSPITAL_BASED_OUTPATIENT_CLINIC_OR_DEPARTMENT_OTHER): Payer: BC Managed Care – PPO | Admitting: Nurse Practitioner

## 2020-07-07 VITALS — BP 135/98 | HR 97 | Temp 97.0°F | Resp 16 | Wt 154.3 lb

## 2020-07-07 DIAGNOSIS — C50919 Malignant neoplasm of unspecified site of unspecified female breast: Secondary | ICD-10-CM

## 2020-07-07 DIAGNOSIS — Z1231 Encounter for screening mammogram for malignant neoplasm of breast: Secondary | ICD-10-CM

## 2020-07-07 DIAGNOSIS — Z853 Personal history of malignant neoplasm of breast: Secondary | ICD-10-CM | POA: Diagnosis not present

## 2020-07-07 NOTE — Progress Notes (Signed)
Rachel Harrell, [place] [address]   CLINIC:  Medical Oncology/Hematology  PCP:  Glenda Chroman, MD [place] [address] [phone]   REASON FOR VISIT: Follow-up for breast cancer   CURRENT THERAPY: Surveillance  BRIEF ONCOLOGIC HISTORY:  Oncology History  Invasive ductal carcinoma of left breast  08/06/2006 Procedure   Left breast needle biopsy   08/06/2006 Pathology Results   BREAST, LEFT, NEEDLE BIOPSY AT 3 O'CLOCK POSITION: - INVASIVE DUCTAL CARCINOMA. - FOCAL DUCTAL CARCINOMA IN SITU, CRIBRIFORM TYPE, INTERMEDIATE TO HIGH GRADE WITH FOCAL NECROSIS.   08/20/2006 Pathology Results   LEFT BREAST, SUBDERMAL MASS, NEEDLE CORE BIOPSIES: INVASIVE MAMMARY CARCINOMA.   09/10/2006 Pathology Results   BREAST, LEFT, SIMPLE MASTECTOMY: - MULTICENTRIC INVASIVE DUCTAL CARCINOMA, LARGEST AREA 1.5 CM, MSBR II OF III. - DUCTAL CARCINOMA IN SITU (DCIS), HIGH GRADE WITH NECROSIS AND CALCIFICATIONS. - EXTENSIVE INTRADUCTAL COMPONENT (JCRT) NEGATIVE   10/07/2006 - 01/13/2007 Chemotherapy   FEC x 6 cycles in dose dense fashion (approximate treatment dates)   01/27/2007 - 03/10/2007 Radiation Therapy     03/20/2007 - 04/19/2009 Anti-estrogen oral therapy   Tamoxifen   04/20/2009 - 04/30/2012 Anti-estrogen oral therapy   Arimidex   04/30/2012 - 03/07/2017 Anti-estrogen oral therapy   Tamoxifen   12/30/2015 Genetic Testing   Genetic testing was normal, and did not reveal a deleterious mutation in these genes. The test report has been scanned into EPIC and is located under the Molecular Pathology section of the Results Review tab.       CANCER STAGING: Cancer Staging Invasive ductal carcinoma of left breast Staging form: Breast, AJCC 7th Edition - Clinical: Stage IIIB (T4b, N2a, cM0) - Signed by Baird Cancer, PA on 03/28/2011    INTERVAL HISTORY:  Rachel Harrell 59 y.o. female returns for routine follow-up for breast cancer.  Patient  reports she is been doing well since her last appointment.  She denies any new lumps or bumps present.  She denies any new bone pain. Denies any nausea, vomiting, or diarrhea. Denies any new pains. Had not noticed any recent bleeding such as epistaxis, hematuria or hematochezia. Denies recent chest pain on exertion, shortness of breath on minimal exertion, pre-syncopal episodes, or palpitations. Denies any numbness or tingling in hands or feet. Denies any recent fevers, infections, or recent hospitalizations. Patient reports appetite at 100% and energy level at 100%.     REVIEW OF SYSTEMS:  Review of Systems  All other systems reviewed and are negative.    PAST MEDICAL/SURGICAL HISTORY:  Past Medical History:  Diagnosis Date  . Breast cancer ([place]) 2007   IDC+DCIS of L breast; ER/PR+, Her2-  . 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 HYSTEROTOMY     1 ovary remains  . LAPAROSCOPY     endometrosis  . lt. mastectomy    . PORT WINE STAIN REMOVAL W/ LASER    . PORTACATH PLACEMENT       SOCIAL HISTORY:  Social History   Socioeconomic History  . Marital status: Married    Spouse name: Not on file  . Number of children: Not on file  . Years of education: Not on file  . Highest education level: Not on file  Occupational History  . Not on file  Tobacco Use  . Smoking status: Never Smoker  . Smokeless tobacco: Never Used  Vaping Use  . Vaping Use:  Never used  Substance and Sexual Activity  . Alcohol use: Yes    Comment: very occ  . Drug use: No  . Sexual activity: Not on file  Other Topics Concern  . Not on file  Social History Narrative  . Not on file   Social Determinants of Health   Financial Resource Strain:   . Difficulty of Paying Living Expenses: Not on file  Food Insecurity:   . Worried About Charity fundraiser in the Last Year: Not on file  . Ran Out of Food in the Last Year: Not on file    Transportation Needs:   . Lack of Transportation (Medical): Not on file  . Lack of Transportation (Non-Medical): Not on file  Physical Activity:   . Days of Exercise per Week: Not on file  . Minutes of Exercise per Session: Not on file  Stress:   . Feeling of Stress : Not on file  Social Connections:   . Frequency of Communication with Friends and Family: Not on file  . Frequency of Social Gatherings with Friends and Family: Not on file  . Attends Religious Services: Not on file  . Active Member of Clubs or Organizations: Not on file  . Attends Archivist Meetings: Not on file  . Marital Status: Not on file  Intimate Partner Violence:   . Fear of Current or Ex-Partner: Not on file  . Emotionally Abused: Not on file  . Physically Abused: Not on file  . Sexually Abused: Not on file    FAMILY HISTORY:  Family History  Problem Relation Age of Onset  . Alzheimer's disease Father   . Alzheimer's disease Maternal Grandmother   . Anesthesia problems Neg Hx     CURRENT MEDICATIONS:  Outpatient Encounter Medications as of 07/07/2020  Medication Sig  . Calcium Carb-Cholecalciferol (CALTRATE 600+D) 600-800 MG-UNIT TABS Take 1 tablet by mouth 2 (two) times daily.  Marland Kitchen ibuprofen (ADVIL) 800 MG tablet Take 1 tablet (800 mg total) by mouth 3 (three) times daily.  . Melatonin 10 MG TABS Take 1 tablet by mouth every evening.  . Misc. Devices MISC Please provide patient with (7) seven mastectomy bras and (2) two prosthesis due to mastectomy  . Multiple Vitamin (MULTIVITAMIN) tablet Take 1 tablet by mouth daily.    . ergocalciferol (VITAMIN D2) 1.25 MG (50000 UT) capsule Take 1 capsule (50,000 Units total) by mouth once a week. (Patient not taking: Reported on 07/07/2020)   No facility-administered encounter medications on file as of 07/07/2020.    ALLERGIES:  Allergies  Allergen Reactions  . Codeine Nausea And Vomiting  . Morphine And Related Nausea And Vomiting     PHYSICAL  EXAM:  ECOG Performance status: 1  Vitals:   07/07/20 1411  BP: (!) 135/98  Pulse: 97  Resp: 16  Temp: (!) 97 F (36.1 C)  SpO2: 100%   Filed Weights   07/07/20 1411  Weight: 154 lb 5.2 oz (70 kg)   Physical Exam Constitutional:      Appearance: Normal appearance. She is normal weight.  Cardiovascular:     Rate and Rhythm: Normal rate and regular rhythm.     Heart sounds: Normal heart sounds.  Pulmonary:     Effort: Pulmonary effort is normal.     Breath sounds: Normal breath sounds.  Abdominal:     General: Bowel sounds are normal.     Palpations: Abdomen is soft.  Musculoskeletal:        General:  Normal range of motion.  Skin:    General: Skin is warm.  Neurological:     Mental Status: She is alert and oriented to person, place, and time. Mental status is at baseline.  Psychiatric:        Mood and Affect: Mood normal.        Behavior: Behavior normal.        Thought Content: Thought content normal.        Judgment: Judgment normal.      LABORATORY DATA:  I have reviewed the labs as listed.  CBC    Component Value Date/Time   WBC 9.3 06/30/2020 1530   RBC 4.77 06/30/2020 1530   HGB 13.6 06/30/2020 1530   HCT 42.1 06/30/2020 1530   PLT 270 06/30/2020 1530   MCV 88.3 06/30/2020 1530   MCH 28.5 06/30/2020 1530   MCHC 32.3 06/30/2020 1530   RDW 11.9 06/30/2020 1530   LYMPHSABS 1.9 06/30/2020 1530   MONOABS 0.8 06/30/2020 1530   EOSABS 0.3 06/30/2020 1530   BASOSABS 0.1 06/30/2020 1530   CMP Latest Ref Rng & Units 06/30/2020 06/16/2019 11/16/2016  Glucose 70 - 99 mg/dL 104(H) 99 107(H)  BUN 6 - 20 mg/dL _0 Creatinine 0.44 - 1.00 mg/dL 0.81 0.82 0.97  Sodium 135 - 145 mmol/L 139 140 137  Potassium 3.5 - 5.1 mmol/L 3.7 3.4(L) 4.0  Chloride 98 - 111 mmol/L 105 106 102  CO2 22 - 32 mmol/L _1 Calcium 8.9 - 10.3 mg/dL 8.8(L) 8.8(L) 8.8(L)  Total Protein 6.5 - 8.1 g/dL 7.4 7.1 -  Total Bilirubin 0.3 - 1.2 mg/dL 0.7 0.7 -  Alkaline Phos 38 - 126  U/L 69 63 -  AST 15 - 41 U/L 20 18 -  ALT 0 - 44 U/L 17 15 -   All questions were answered to patient's stated satisfaction. Encouraged patient to call with any new concerns or questions before his next visit to the cancer center and we can certain see him sooner, if needed.     ASSESSMENT & PLAN:  Invasive ductal carcinoma of left breast 1.  Stage IIIb left breast cancer: -Diagnosed in 07/2006, ER+/PR+/HER-,treated with left mastectomy, adjuvant chemo with FEC x 6 cycles (completed in 01/2007), followed by adjuvant radiation therapy. -She then started anti-estrogen therapy with tamoxifen in 03/2007-04/2009., was switched to Arimidex in 04/2009-04/2012 at which time she was switched back to Tamoxifen 04/2012. -She is completed 10 years of endocrine therapy on 03/2017. -She is also status post hysterectomy/partial oophorectomy 09/10/2006. - Last mammogram done on 07/01/2019 was BI-RADS Category 1 negative. -She is due for mammogram at this time.  We will make sure that scheduled. -Physical assessment done today did not reveal any adenopathy or masses. -Labs done on 06/30/2020 were all WNL. -She will be followed by her PCP at this time.  2.  Vitamin D deficiency: -Labs done on 06/30/2020 showed vitamin D level at 28.90 -I have called in a prescription for 50,000 units weekly. -We will recheck labs at her next visit.  3. Health maintenance: -Patient does not wish to under go DEXA scan or colonoscopy at this time. -She will follow up with her PCP.      Orders placed this encounter:  Orders Placed This Encounter  Procedures  . MM 3D SCREEN BREAST UNI RIGHT  . Lactate dehydrogenase  . CBC with Differential/Platelet  . Comprehensive metabolic panel  . Vitamin B12  . VITAMIN D 25 Hydroxy (Vit-D  Deficiency, Fractures)      Francene Finders, FNP-C [facility] [phone]

## 2020-07-07 NOTE — Assessment & Plan Note (Addendum)
1.  Stage IIIb left breast cancer: -Diagnosed in 07/2006, ER+/PR+/HER-,treated with left mastectomy, adjuvant chemo with FEC x 6 cycles (completed in 01/2007), followed by adjuvant radiation therapy. -She then started anti-estrogen therapy with tamoxifen in 03/2007-04/2009., was switched to Arimidex in 04/2009-04/2012 at which time she was switched back to Tamoxifen 04/2012. -She is completed 10 years of endocrine therapy on 03/2017. -She is also status post hysterectomy/partial oophorectomy 09/10/2006. - Last mammogram done on 07/01/2019 was BI-RADS Category 1 negative. -She is due for mammogram at this time.  We will make sure that scheduled. -Physical assessment done today did not reveal any adenopathy or masses. -Labs done on 06/30/2020 were all WNL. -She will be followed by her PCP at this time.  2.  Vitamin D deficiency: -Labs done on 06/30/2020 showed vitamin D level at 28.90 -I have called in a prescription for 50,000 units weekly. -We will recheck labs at her next visit.  3. Health maintenance: -Patient does not wish to under go DEXA scan or colonoscopy at this time. -She will follow up with her PCP.

## 2020-07-11 ENCOUNTER — Ambulatory Visit (HOSPITAL_COMMUNITY): Payer: BC Managed Care – PPO

## 2020-07-28 ENCOUNTER — Ambulatory Visit: Payer: BC Managed Care – PPO | Admitting: Orthopedic Surgery

## 2020-08-04 ENCOUNTER — Ambulatory Visit: Payer: BC Managed Care – PPO

## 2020-08-04 ENCOUNTER — Ambulatory Visit (INDEPENDENT_AMBULATORY_CARE_PROVIDER_SITE_OTHER): Payer: BC Managed Care – PPO | Admitting: Orthopedic Surgery

## 2020-08-04 ENCOUNTER — Encounter: Payer: Self-pay | Admitting: Orthopedic Surgery

## 2020-08-04 ENCOUNTER — Other Ambulatory Visit: Payer: Self-pay

## 2020-08-04 VITALS — Ht 69.0 in | Wt 154.0 lb

## 2020-08-04 DIAGNOSIS — S82842D Displaced bimalleolar fracture of left lower leg, subsequent encounter for closed fracture with routine healing: Secondary | ICD-10-CM

## 2020-08-04 NOTE — Progress Notes (Signed)
Chief Complaint  Patient presents with  . Fracture    DOI 03/30/20    Rachel Harrell is status post closed treatment of bimalleolar left ankle fracture she still complaining of some swelling but for the most part is regained normal activity  Imaging today shows a stable bimalleolar ankle fracture with fibrous union of the medial malleolus radiographic healing of the lateral malleolus and in intact stable mortise  Her exam shows that she is regained 95% of her range of motion her ankle is stable  She does have a area of numbness from the initial trauma approximately 4 to 5 cm from the ankle joint.  The patient is encouraged to continue normal activities follow-up as needed

## 2020-10-09 IMAGING — MG MM DIGITAL SCREENING UNILAT*R* W/ TOMO W/ CAD
4 series · 4 of 12 positions shown · non-contrast
Comparison: Previous exam(s).

CLINICAL DATA: Screening.

EXAM:
DIGITAL SCREENING UNILATERAL RIGHT MAMMOGRAM WITH CAD AND TOMO

[R MLO synth-2D]
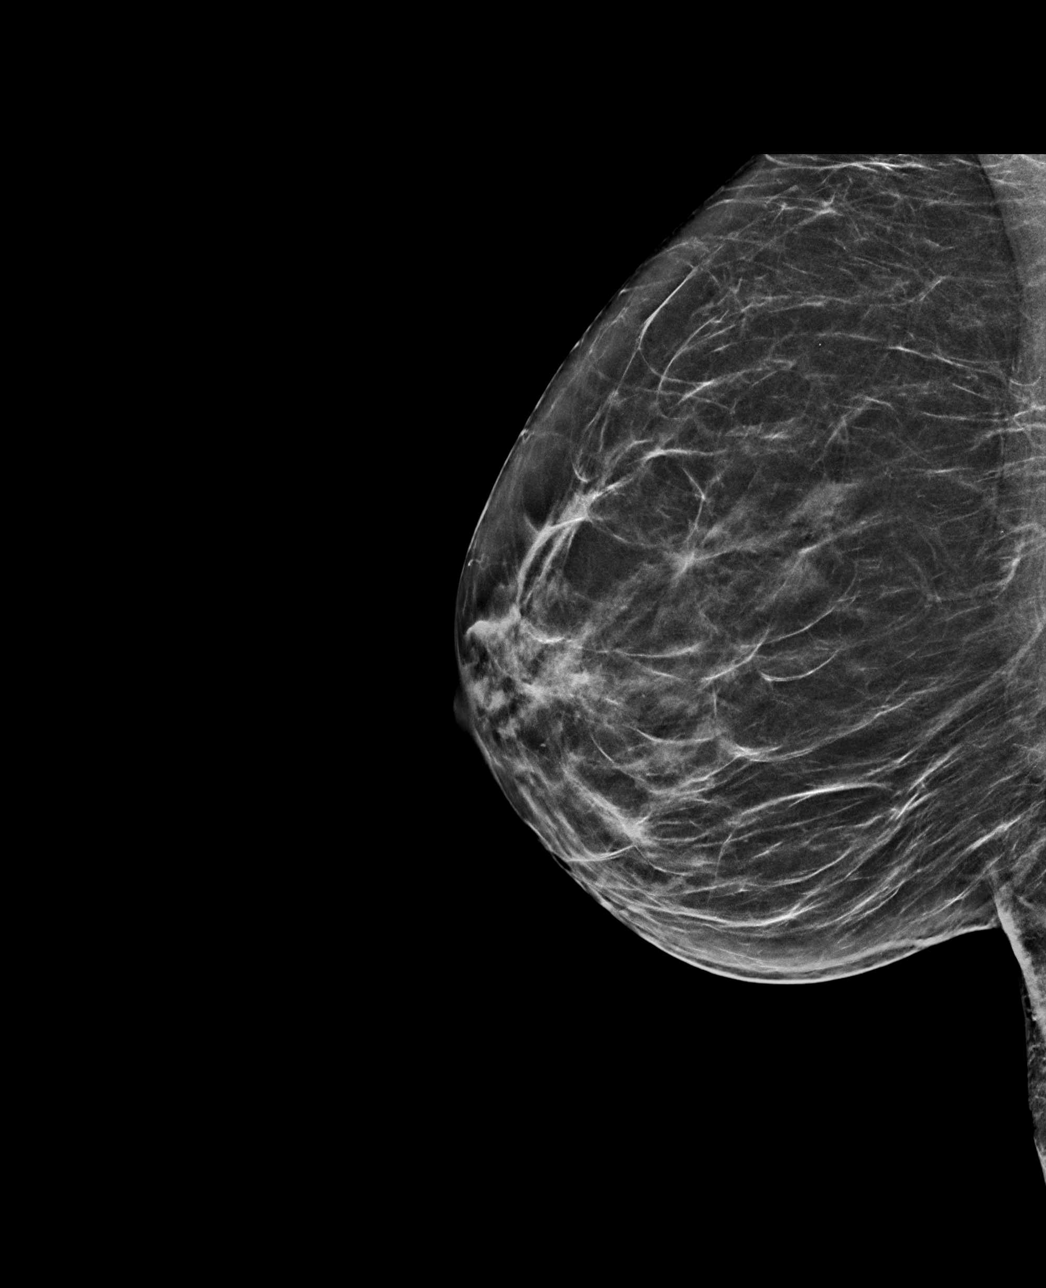

[R CC synth-2D]
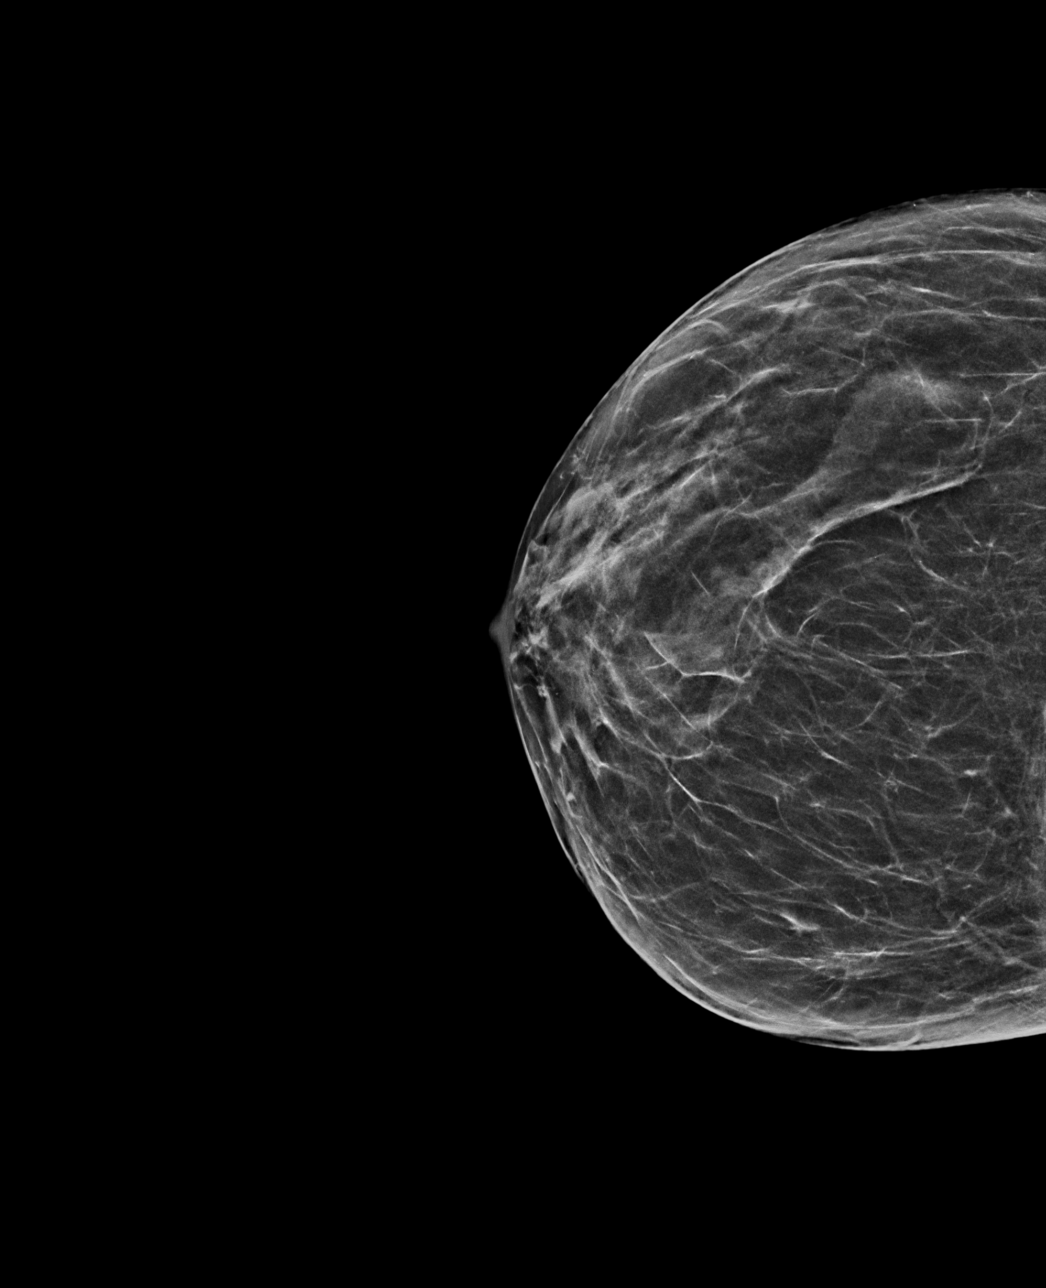

[R CC tomo · tomo slice 28/55.0]
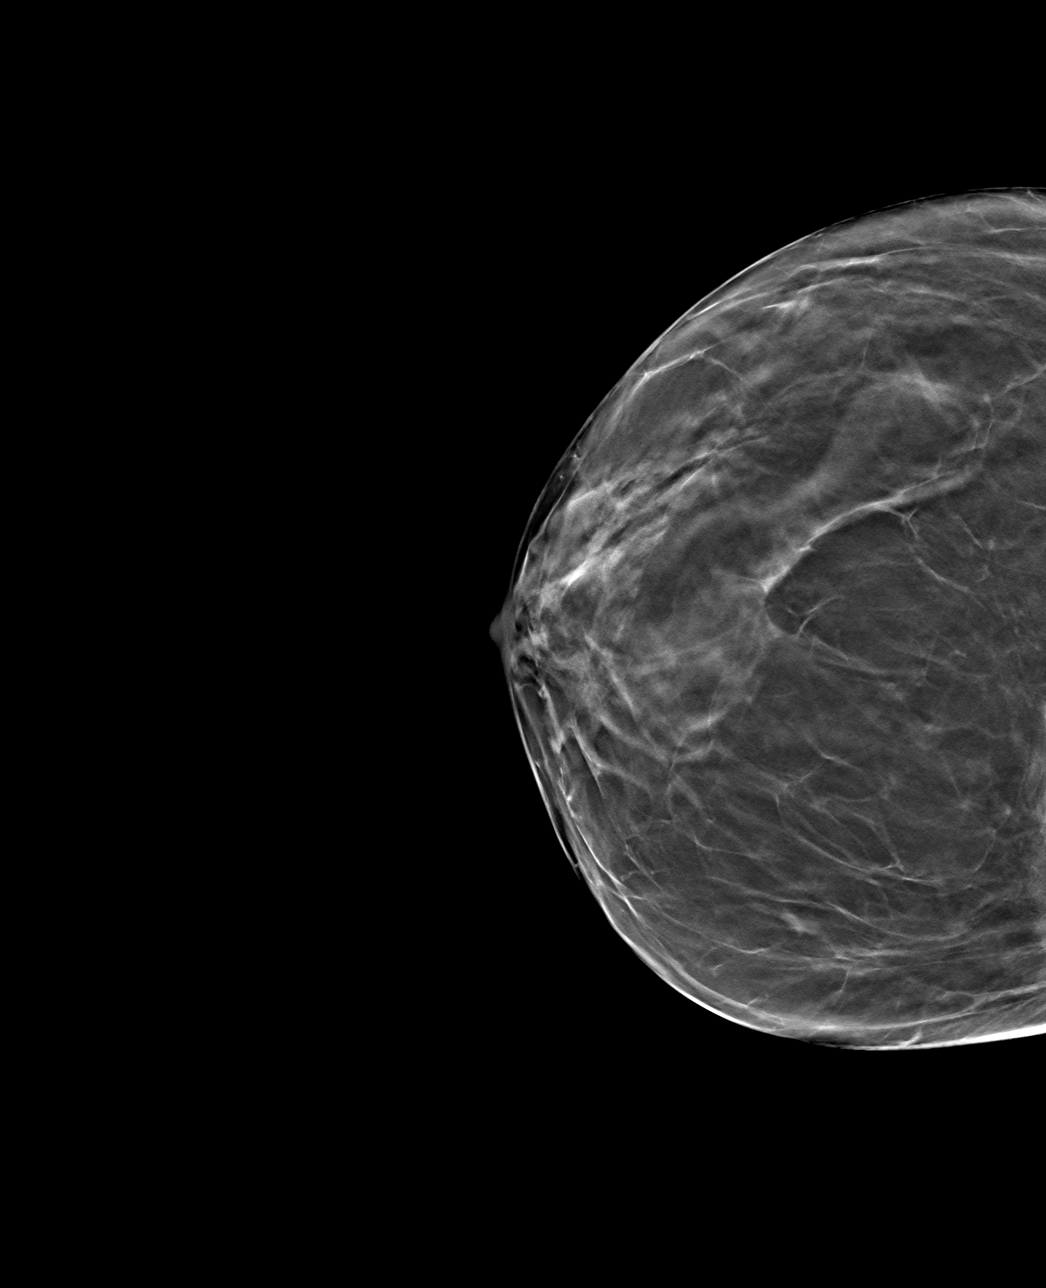

[R MLO tomo · tomo slice 29/57.0]
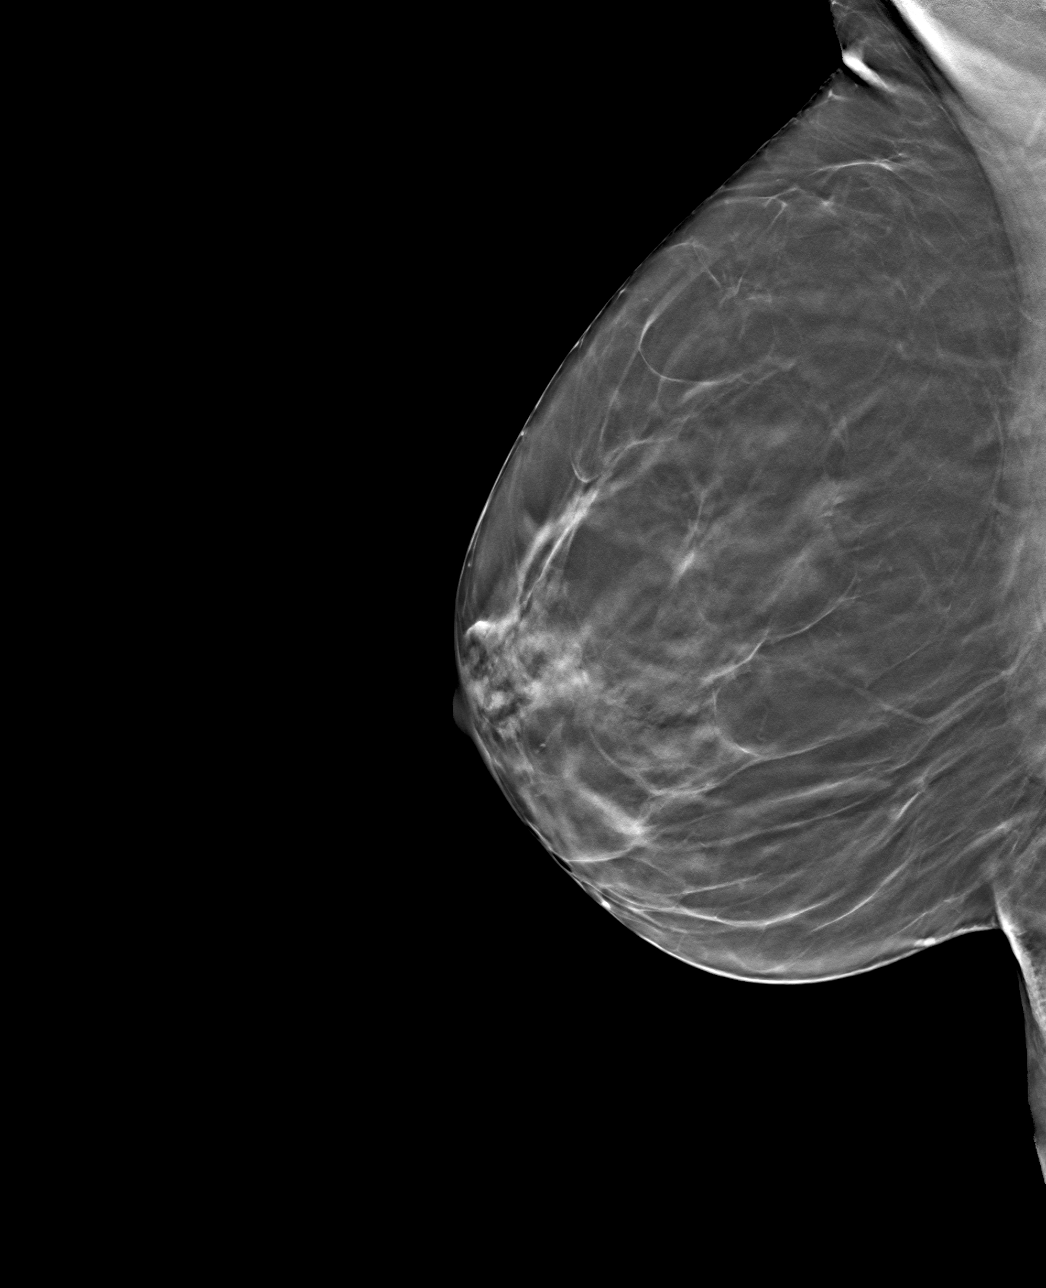

[4 of 12 positions shown; findings below may reference images not displayed]

ACR Breast Density Category b: There are scattered areas of
fibroglandular density.
FINDINGS: The patient has had a left mastectomy. There are no findings
suspicious for malignancy. Images were processed with CAD.
IMPRESSION: No mammographic evidence of malignancy. A result letter of this
screening mammogram will be mailed directly to the patient.

RECOMMENDATION:
Screening mammogram in one year.  (Code:H3-9-9K3)

BI-RADS CATEGORY  1: Negative.

## 2021-02-23 ENCOUNTER — Other Ambulatory Visit (HOSPITAL_COMMUNITY): Payer: Self-pay | Admitting: Hematology

## 2021-02-23 ENCOUNTER — Encounter (HOSPITAL_COMMUNITY): Payer: Self-pay

## 2021-02-23 ENCOUNTER — Ambulatory Visit (HOSPITAL_COMMUNITY)
Admission: RE | Admit: 2021-02-23 | Discharge: 2021-02-23 | Disposition: A | Discharge status: Home / Self Care | Payer: BC Managed Care – PPO | Source: Ambulatory Visit | Attending: Nurse Practitioner | Admitting: Nurse Practitioner

## 2021-02-23 DIAGNOSIS — Z1231 Encounter for screening mammogram for malignant neoplasm of breast: Secondary | ICD-10-CM

## 2021-06-04 ENCOUNTER — Other Ambulatory Visit: Payer: Self-pay

## 2021-06-04 ENCOUNTER — Emergency Department (HOSPITAL_COMMUNITY)
Admission: EM | Admit: 2021-06-04 | Discharge: 2021-06-04 | Discharge status: Against Medical Advice | Payer: BC Managed Care – PPO

## 2021-07-08 IMAGING — DX DG ANKLE COMPLETE 3+V*L*
3 series · 3 of 3 positions shown · non-contrast
Comparison: None.

CLINICAL DATA: Slabs with sheet rock fell onto patient. Left ankle
and lower leg pain.

EXAM:
LEFT ANKLE COMPLETE - 3+ VIEW

[ankle ap]
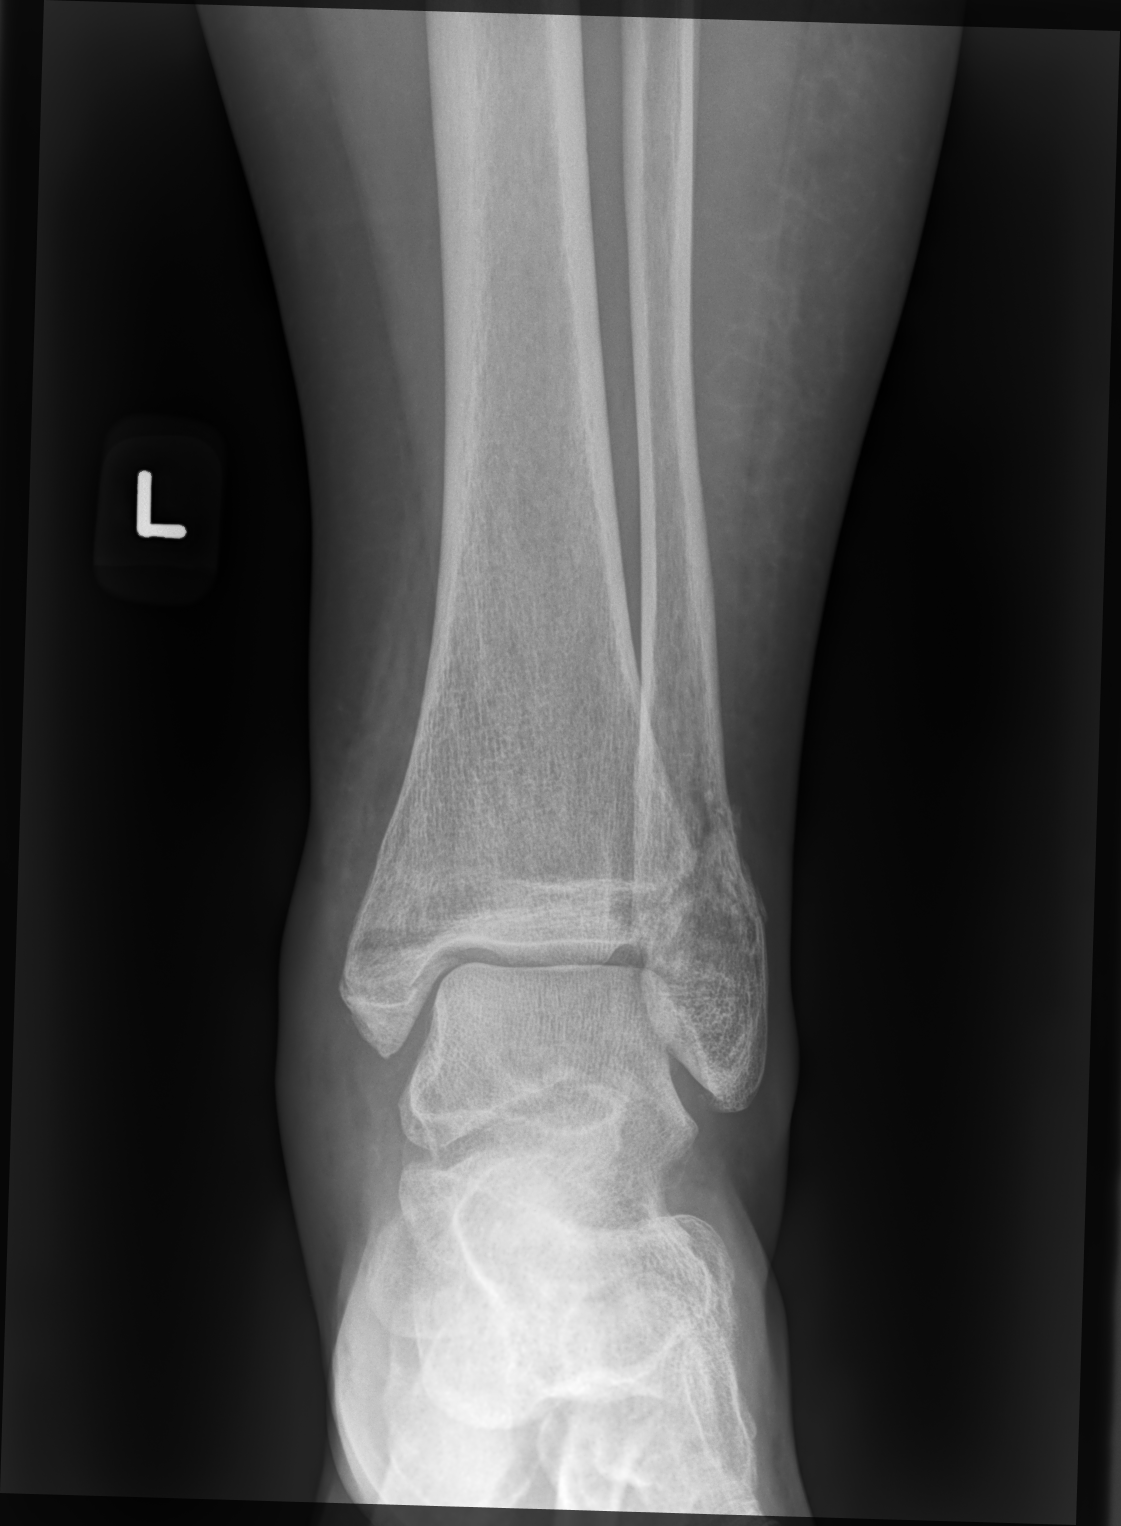

[ankle mlo]
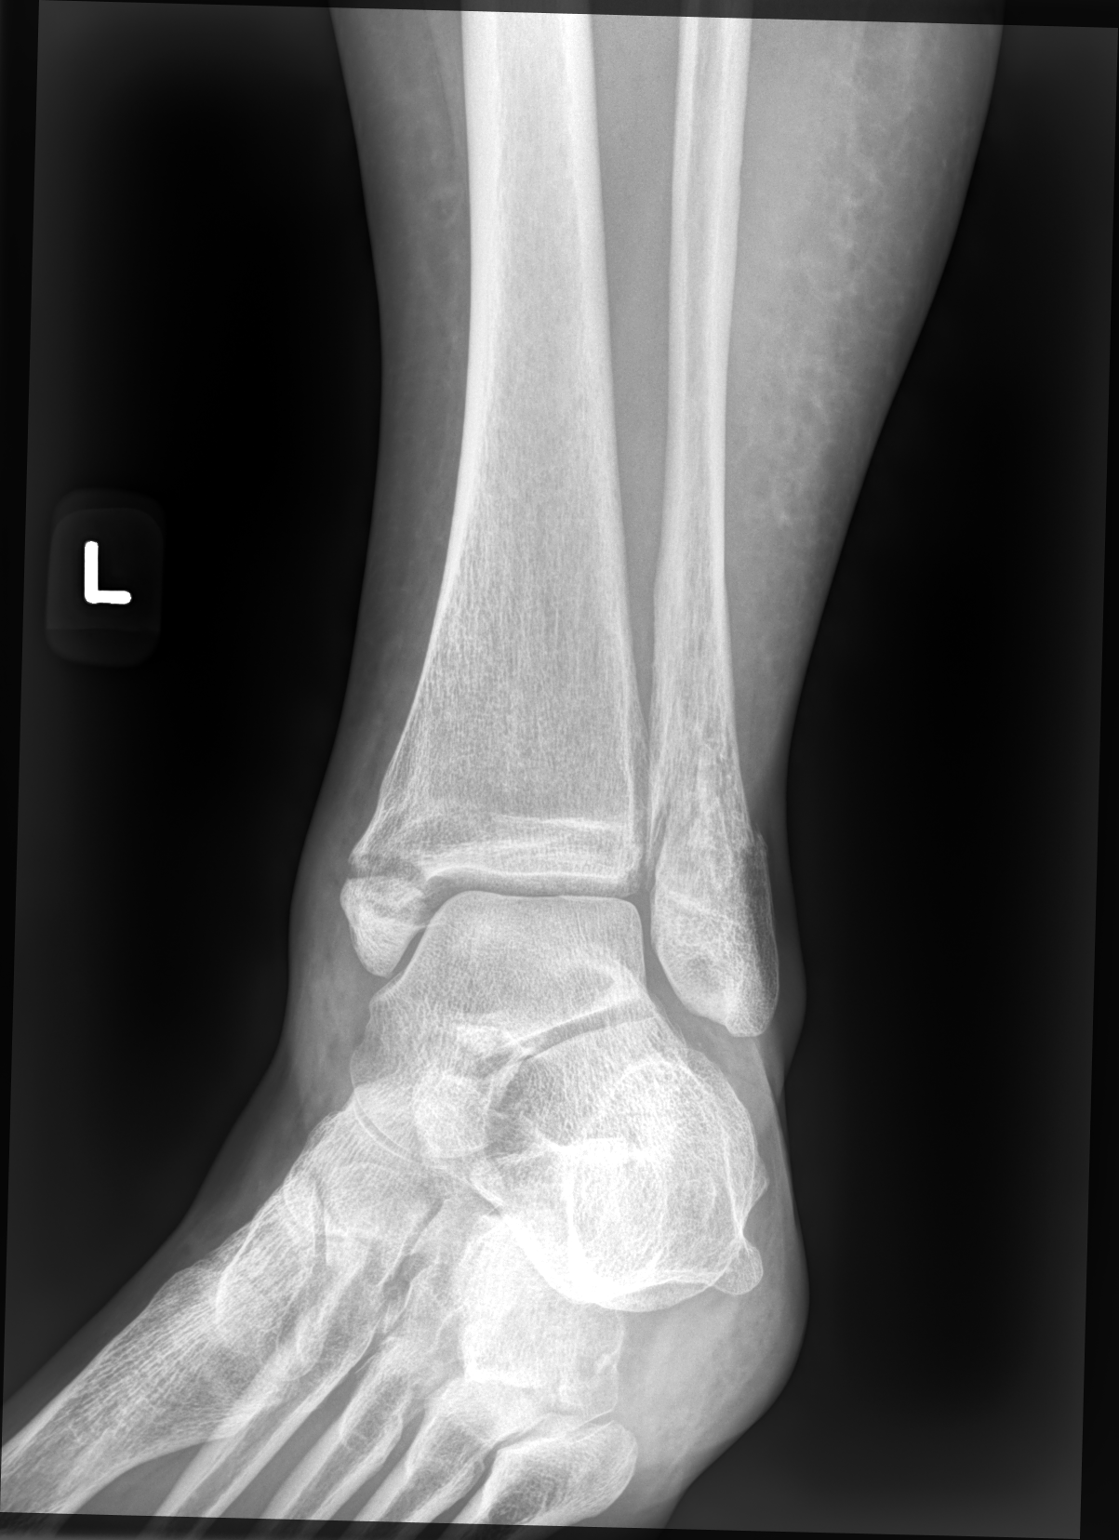

[ankle lat]
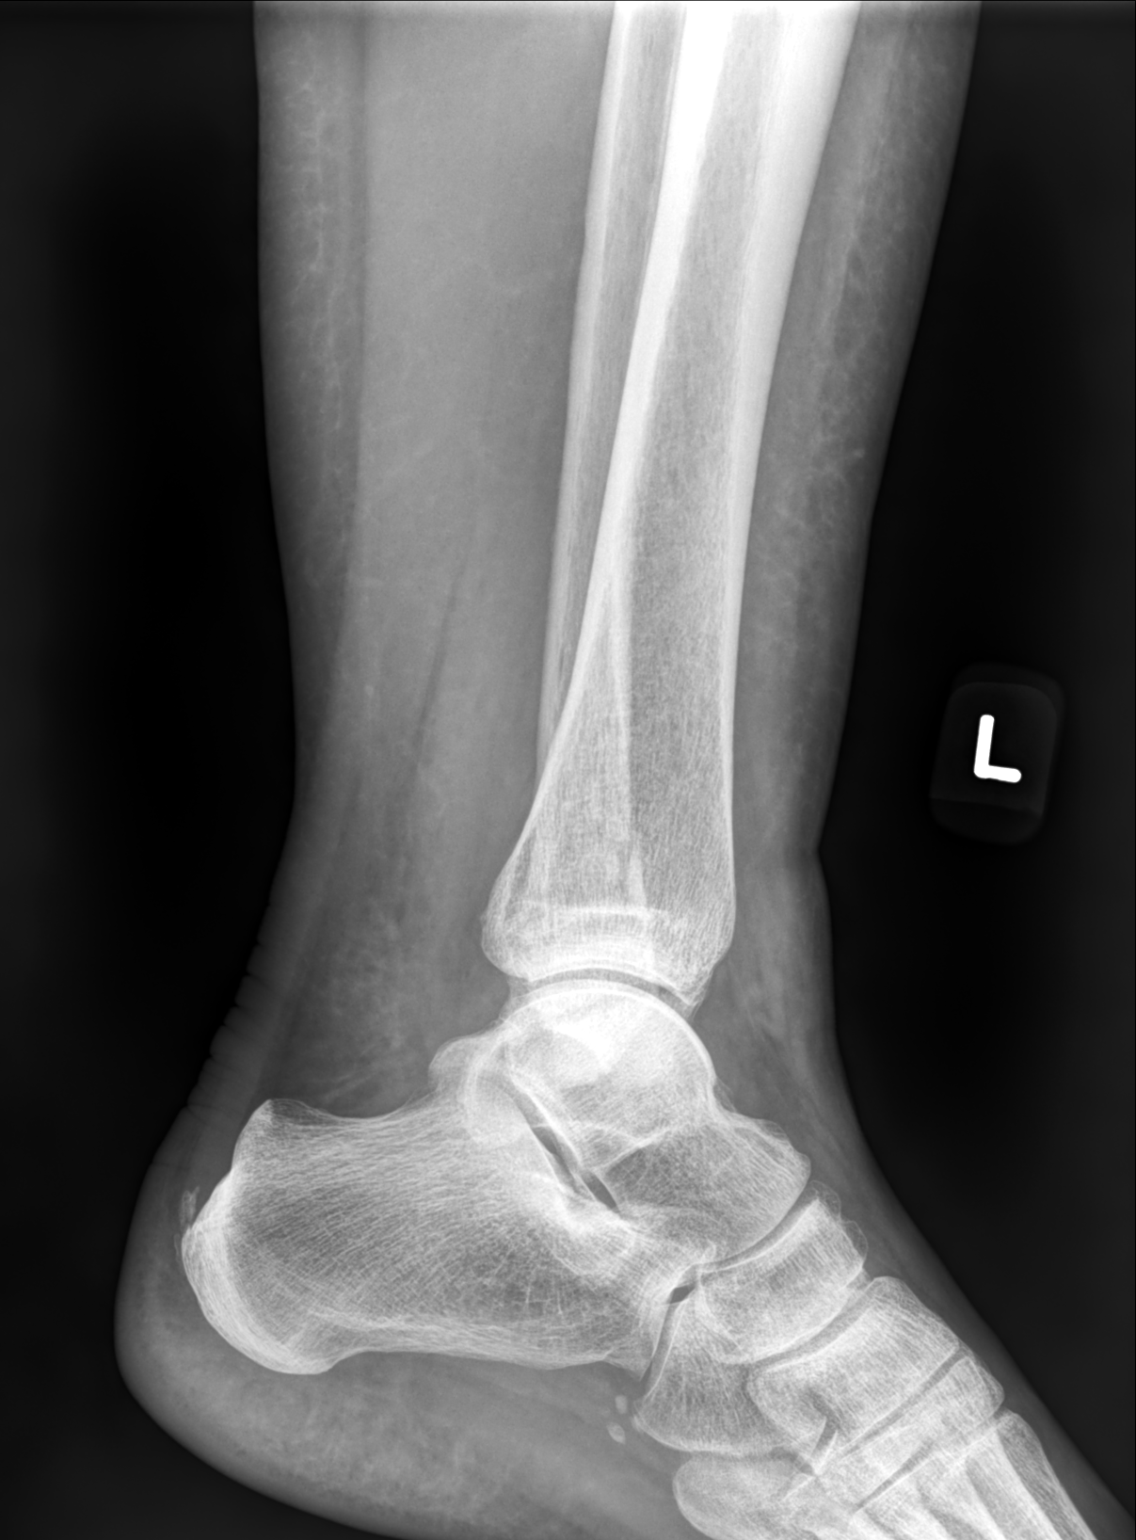

[3 of 3 positions shown; findings below may reference images not displayed]

FINDINGS: Displaced distal fibular fracture at the level of the ankle mortise.
Transverse medial malleolar fracture at the level of the ankle
mortise. There is mild widening of the lateral clear space. No
evidence of posterior tibial fracture. Hindfoot alignment is
maintained. Small ankle joint effusion. Generalized soft tissue
edema, lateral greater than medial.
IMPRESSION: Displaced bimalleolar fractures at the level of the ankle mortise
with mild widening of the lateral clear space.

## 2022-02-21 ENCOUNTER — Other Ambulatory Visit (HOSPITAL_COMMUNITY): Payer: Self-pay | Admitting: Nurse Practitioner

## 2022-02-21 DIAGNOSIS — Z1231 Encounter for screening mammogram for malignant neoplasm of breast: Secondary | ICD-10-CM

## 2022-02-22 ENCOUNTER — Encounter (INDEPENDENT_AMBULATORY_CARE_PROVIDER_SITE_OTHER): Payer: Self-pay | Admitting: *Deleted

## 2022-02-23 ENCOUNTER — Encounter (HOSPITAL_COMMUNITY): Payer: Self-pay

## 2022-02-23 ENCOUNTER — Ambulatory Visit (HOSPITAL_COMMUNITY)
Admission: RE | Admit: 2022-02-23 | Discharge: 2022-02-23 | Disposition: A | Discharge status: Home / Self Care | Payer: BC Managed Care – PPO | Source: Ambulatory Visit | Attending: Nurse Practitioner | Admitting: Nurse Practitioner

## 2022-02-23 DIAGNOSIS — Z1231 Encounter for screening mammogram for malignant neoplasm of breast: Secondary | ICD-10-CM | POA: Insufficient documentation

## 2022-02-26 ENCOUNTER — Ambulatory Visit (HOSPITAL_COMMUNITY)
Admission: RE | Admit: 2022-02-26 | Discharge: 2022-02-26 | Disposition: A | Discharge status: Home / Self Care | Payer: BC Managed Care – PPO | Source: Ambulatory Visit | Attending: Nurse Practitioner | Admitting: Nurse Practitioner

## 2022-02-26 DIAGNOSIS — Z1231 Encounter for screening mammogram for malignant neoplasm of breast: Secondary | ICD-10-CM | POA: Diagnosis present

## 2022-08-13 ENCOUNTER — Encounter (INDEPENDENT_AMBULATORY_CARE_PROVIDER_SITE_OTHER): Payer: Self-pay | Admitting: *Deleted

## 2022-12-06 ENCOUNTER — Encounter: Payer: Self-pay | Admitting: Radiology

## 2023-02-28 ENCOUNTER — Encounter (INDEPENDENT_AMBULATORY_CARE_PROVIDER_SITE_OTHER): Payer: Self-pay | Admitting: *Deleted

## 2023-08-12 ENCOUNTER — Encounter (INDEPENDENT_AMBULATORY_CARE_PROVIDER_SITE_OTHER): Payer: Self-pay | Admitting: *Deleted

## 2024-03-12 ENCOUNTER — Other Ambulatory Visit (HOSPITAL_COMMUNITY): Payer: Self-pay | Admitting: Internal Medicine

## 2024-03-12 DIAGNOSIS — Z1231 Encounter for screening mammogram for malignant neoplasm of breast: Secondary | ICD-10-CM

## 2024-03-16 ENCOUNTER — Ambulatory Visit (HOSPITAL_COMMUNITY): Payer: Self-pay

## 2024-03-16 ENCOUNTER — Encounter (HOSPITAL_COMMUNITY): Payer: Self-pay

## 2024-03-18 ENCOUNTER — Encounter (HOSPITAL_COMMUNITY): Payer: Self-pay

## 2024-03-18 ENCOUNTER — Other Ambulatory Visit (HOSPITAL_COMMUNITY): Payer: Self-pay | Admitting: Internal Medicine

## 2024-03-18 ENCOUNTER — Ambulatory Visit (HOSPITAL_COMMUNITY)
Admission: RE | Admit: 2024-03-18 | Discharge: 2024-03-18 | Disposition: A | Discharge status: Home / Self Care | Payer: Self-pay | Source: Ambulatory Visit | Attending: Internal Medicine | Admitting: Internal Medicine

## 2024-03-18 DIAGNOSIS — Z1231 Encounter for screening mammogram for malignant neoplasm of breast: Secondary | ICD-10-CM

## 2024-04-01 ENCOUNTER — Encounter (INDEPENDENT_AMBULATORY_CARE_PROVIDER_SITE_OTHER): Payer: Self-pay | Admitting: *Deleted

## 2024-10-06 ENCOUNTER — Encounter (INDEPENDENT_AMBULATORY_CARE_PROVIDER_SITE_OTHER): Payer: Self-pay | Admitting: *Deleted
